# Patient Record
Sex: Male | Born: 1965 | Race: White | Hispanic: No | Marital: Married | Smoking: Current every day smoker
Health system: Southern US, Community
[De-identification: ages and names within clinical notes are randomized; demographics above are authoritative.]

## PROBLEM LIST (undated history)

## (undated) ENCOUNTER — Emergency Department (HOSPITAL_COMMUNITY): Payer: BC Managed Care – PPO | Source: Home / Self Care

## (undated) DIAGNOSIS — E785 Hyperlipidemia, unspecified: Secondary | ICD-10-CM

## (undated) DIAGNOSIS — G43909 Migraine, unspecified, not intractable, without status migrainosus: Secondary | ICD-10-CM

## (undated) DIAGNOSIS — F419 Anxiety disorder, unspecified: Secondary | ICD-10-CM

## (undated) DIAGNOSIS — I10 Essential (primary) hypertension: Secondary | ICD-10-CM

## (undated) HISTORY — DX: Essential (primary) hypertension: I10

## (undated) HISTORY — DX: Migraine, unspecified, not intractable, without status migrainosus: G43.909

## (undated) HISTORY — PX: FRACTURE SURGERY: SHX138

## (undated) HISTORY — DX: Hyperlipidemia, unspecified: E78.5

## (undated) HISTORY — DX: Anxiety disorder, unspecified: F41.9

---

## 1998-10-17 ENCOUNTER — Encounter: Payer: Self-pay | Admitting: Emergency Medicine

## 1998-10-17 ENCOUNTER — Emergency Department (HOSPITAL_COMMUNITY): Admission: EM | Admit: 1998-10-17 | Discharge: 1998-10-17 | Payer: Self-pay | Admitting: Emergency Medicine

## 2002-08-26 ENCOUNTER — Emergency Department (HOSPITAL_COMMUNITY): Admission: EM | Admit: 2002-08-26 | Discharge: 2002-08-26 | Payer: Self-pay | Admitting: Emergency Medicine

## 2002-08-26 ENCOUNTER — Encounter: Payer: Self-pay | Admitting: Emergency Medicine

## 2002-09-15 ENCOUNTER — Encounter (HOSPITAL_COMMUNITY): Admission: RE | Admit: 2002-09-15 | Discharge: 2002-10-15 | Payer: Self-pay | Admitting: *Deleted

## 2013-09-23 ENCOUNTER — Ambulatory Visit: Payer: Self-pay | Admitting: General Practice

## 2013-09-27 ENCOUNTER — Ambulatory Visit (INDEPENDENT_AMBULATORY_CARE_PROVIDER_SITE_OTHER): Payer: BC Managed Care – PPO

## 2013-09-27 ENCOUNTER — Encounter (INDEPENDENT_AMBULATORY_CARE_PROVIDER_SITE_OTHER): Payer: Self-pay

## 2013-09-27 ENCOUNTER — Encounter: Payer: Self-pay | Admitting: Family Medicine

## 2013-09-27 ENCOUNTER — Ambulatory Visit (INDEPENDENT_AMBULATORY_CARE_PROVIDER_SITE_OTHER): Payer: BC Managed Care – PPO | Admitting: Family Medicine

## 2013-09-27 VITALS — BP 146/98 | HR 97 | Temp 97.4°F | Ht 73.0 in | Wt 278.0 lb

## 2013-09-27 DIAGNOSIS — R42 Dizziness and giddiness: Secondary | ICD-10-CM

## 2013-09-27 DIAGNOSIS — M542 Cervicalgia: Secondary | ICD-10-CM

## 2013-09-27 DIAGNOSIS — S46909A Unspecified injury of unspecified muscle, fascia and tendon at shoulder and upper arm level, unspecified arm, initial encounter: Secondary | ICD-10-CM

## 2013-09-27 DIAGNOSIS — S4980XA Other specified injuries of shoulder and upper arm, unspecified arm, initial encounter: Secondary | ICD-10-CM

## 2013-09-27 DIAGNOSIS — S4991XA Unspecified injury of right shoulder and upper arm, initial encounter: Secondary | ICD-10-CM

## 2013-09-27 MED ORDER — PREDNISONE 10 MG PO TABS: ORAL_TABLET | ORAL | Status: DC

## 2013-09-27 MED ORDER — HYDROCODONE-ACETAMINOPHEN 5-325 MG PO TABS: 1.0000 | ORAL_TABLET | Freq: Four times a day (QID) | ORAL | Status: DC | PRN

## 2013-09-27 MED ORDER — METAXALONE 800 MG PO TABS: 800.0000 mg | ORAL_TABLET | Freq: Three times a day (TID) | ORAL | Status: DC

## 2013-09-27 NOTE — Progress Notes (Signed)
   Subjective:    Patient ID: Austin Combs, male    DOB: [birth year], 48 y.o.   MRN: [id number]  HPI  Patient is here for pre-syncope.  He states he has been having severe pain in his neck and his Left arm is numb.  Patient states he is in severe pain in his right arm, shoulder, and neck.  He gets headaches and he has one now.  He has neck discomfort.  He has   Review of Systems    No chest pain, SOB, HA, dizziness, vision change, N/V, diarrhea, constipation, dysuria, urinary urgency or frequency, myalgias, arthralgias or rash.  Objective:   Physical Exam  Vital signs noted  Well developed well nourished male.  HEENT - Head atraumatic Normocephalic                Eyes - PERRLA, Conjuctiva - clear Sclera- Clear EOMI and no nystagmus Respiratory - Lungs CTA bilateral Cardiac - RRR S1 and S2 without murmur MS - cervical spine with TTP and decreased ROM, TTP occiput, TTP right shoulder and  Trapezius.  Decreased strength in right arm.    EKG - NSR w/o acute st-t changes  Xray cervical spine - No fx Xray right shoulder - no fx Prelimnary reading by Chrissie NoaWilliam Oxford,FNP    Assessment & Plan:  Dizziness - Plan: EKG 12-Lead, HYDROcodone-acetaminophen (NORCO) 5-325 MG per tablet, predniSONE (DELTASONE) 10 MG tablet, DG Cervical Spine Complete, DG Shoulder Right, metaxalone (SKELAXIN) 800 MG tablet  Right shoulder injury - Plan: HYDROcodone-acetaminophen (NORCO) 5-325 MG per tablet, predniSONE (DELTASONE) 10 MG tablet, DG Cervical Spine Complete, DG Shoulder Right, metaxalone (SKELAXIN) 800 MG tablet  Neck pain - Plan: DG Shoulder Right  Follow up in one week or prn if not better.  Deatra CanterWilliam J Oxford FNP

## 2013-09-29 ENCOUNTER — Encounter: Payer: Self-pay | Admitting: Family Medicine

## 2013-09-29 ENCOUNTER — Telehealth: Payer: Self-pay | Admitting: Family Medicine

## 2013-09-29 ENCOUNTER — Ambulatory Visit (INDEPENDENT_AMBULATORY_CARE_PROVIDER_SITE_OTHER): Payer: BC Managed Care – PPO | Admitting: Family Medicine

## 2013-09-29 VITALS — BP 137/91 | HR 96 | Temp 99.1°F | Wt 271.6 lb

## 2013-09-29 DIAGNOSIS — M5030 Other cervical disc degeneration, unspecified cervical region: Secondary | ICD-10-CM

## 2013-09-29 MED ORDER — OXYCODONE-ACETAMINOPHEN 5-325 MG PO TABS
ORAL_TABLET | ORAL | Status: DC
Start: 2013-09-29 — End: 2013-10-05

## 2013-09-29 NOTE — Telephone Encounter (Signed)
Pt still having severe neck pain Taking meds Informed pt to come in for appt

## 2013-09-29 NOTE — Progress Notes (Signed)
   Subjective:    Patient ID: Austin Combs, male    DOB: 30-May-1966, 48 y.o.   MRN: [id number]  HPI This 48 y.o. male presents for evaluation of severe right neck, shoulder, and arm pain.  He was Seen a few days ago and he was rx'd steroid taper and norco pain medication and he states it is  Not helping and he cannot sleep and he is in severe pain.  He states the pain radiates down from His neck to his hands. He c/o right hand weakness and numbness.  He has had cervical spine Xray which shows DDD of the cervical spine.  He states his arm feels better if he raises it above his Head.   Review of Systems C/o cervical spine pain and cervical radicular sx's   No chest pain, SOB, HA, dizziness, vision change, N/V, diarrhea, constipation, dysuria, urinary urgency or frequency or rash.  Objective:   Physical Exam  Vital signs noted  Well developed well nourished male in pain.  HEENT - Head atraumatic Normocephalic                Eyes - PERRLA, Conjuctiva - clear Sclera- Clear EOMI                Ears - EAC's Wnl TM's Wnl Gross Hearing WNL                Throat - oropharanx wnl Respiratory - Lungs CTA bilateral Cardiac - RRR S1 and S2 without murmur MS - TTP cervical spine, right trapezius region.  Decreased strenght in right UE and hand.         Full ROM right shoulder. Neuro - Diminished sensation to palpation right arm and right hand.      Assessment & Plan:  DDD (degenerative disc disease), cervical - Plan: oxyCODONE-acetaminophen (ROXICET) 5-325 MG per tablet, MR Cervical Spine Wo Contrast Continue steroids, GET MRI ASAP and follow up next week or prn if not better.  Deatra CanterWilliam J Oxford FNP

## 2013-10-04 ENCOUNTER — Ambulatory Visit (HOSPITAL_COMMUNITY)
Admission: RE | Admit: 2013-10-04 | Discharge: 2013-10-04 | Disposition: A | Discharge status: Home / Self Care | Payer: BC Managed Care – PPO | Source: Ambulatory Visit | Attending: Family Medicine | Admitting: Family Medicine

## 2013-10-04 ENCOUNTER — Telehealth: Payer: Self-pay | Admitting: Family Medicine

## 2013-10-04 DIAGNOSIS — M5030 Other cervical disc degeneration, unspecified cervical region: Secondary | ICD-10-CM

## 2013-10-05 ENCOUNTER — Other Ambulatory Visit: Payer: Self-pay | Admitting: Family Medicine

## 2013-10-05 ENCOUNTER — Other Ambulatory Visit: Payer: Self-pay | Admitting: *Deleted

## 2013-10-05 MED ORDER — DIAZEPAM 5 MG PO TABS: ORAL_TABLET | ORAL | Status: DC

## 2013-10-05 MED ORDER — OXYCODONE-ACETAMINOPHEN 5-325 MG PO TABS: ORAL_TABLET | ORAL | Status: DC

## 2013-10-05 NOTE — Telephone Encounter (Signed)
RX given to pt's wife and notified of appt for open MRI today

## 2013-10-05 NOTE — Telephone Encounter (Signed)
Pt notified of MRI results Report faxed to Dr Cassandria SanteeBotero's office Pt will notify us if he does not hear from them tomorrow

## 2013-10-05 NOTE — Telephone Encounter (Signed)
Pt having extreme pain, could not do MRI due to pain Will scheduled open MRI ASAP and refill pain med

## 2013-10-07 ENCOUNTER — Telehealth: Payer: Self-pay | Admitting: Family Medicine

## 2013-10-07 NOTE — Telephone Encounter (Signed)
Spoke with pt Has appt with neurosurgeon on Monday

## 2013-10-07 NOTE — Telephone Encounter (Signed)
Please call.

## 2013-10-07 NOTE — Telephone Encounter (Signed)
They still have not heard from the neurologist and they said if they haven't heard anything from them that you told them to call us back by the end of the week

## 2013-10-13 ENCOUNTER — Telehealth: Payer: Self-pay | Admitting: Family Medicine

## 2013-10-13 NOTE — Telephone Encounter (Signed)
APPT MADE

## 2013-10-14 ENCOUNTER — Telehealth: Payer: Self-pay | Admitting: *Deleted

## 2013-10-14 NOTE — Telephone Encounter (Signed)
Pt had cervical fusion. Symptoms much improved. Needs note for work from 3/10-3/26 faxed to [phone]. [facility] Pt notified.

## 2013-10-19 ENCOUNTER — Ambulatory Visit (INDEPENDENT_AMBULATORY_CARE_PROVIDER_SITE_OTHER): Payer: BC Managed Care – PPO | Admitting: Family Medicine

## 2013-10-19 VITALS — BP 127/83 | HR 114 | Temp 98.3°F | Ht 73.0 in | Wt 255.6 lb

## 2013-10-19 DIAGNOSIS — S46909A Unspecified injury of unspecified muscle, fascia and tendon at shoulder and upper arm level, unspecified arm, initial encounter: Secondary | ICD-10-CM

## 2013-10-19 DIAGNOSIS — R42 Dizziness and giddiness: Secondary | ICD-10-CM

## 2013-10-19 DIAGNOSIS — S4980XA Other specified injuries of shoulder and upper arm, unspecified arm, initial encounter: Secondary | ICD-10-CM

## 2013-10-19 DIAGNOSIS — S4991XA Unspecified injury of right shoulder and upper arm, initial encounter: Secondary | ICD-10-CM

## 2013-10-19 NOTE — Progress Notes (Signed)
   Subjective:    Patient ID: Austin Combs, male    DOB: 29-May-1966, 48 y.o.   MRN: [id number]  HPI This 48 y.o. male presents for evaluation of follow up on cervical spine surgery. He is feeling a lot better and not in as much pain.  He still has some pain in his  Neck and shoulder area bilateral but is not hurting in his left arm.  He had elevated Blood pressure readings while in surgery.  He is due for CPE.   Review of Systems No chest pain, SOB, HA, dizziness, vision change, N/V, diarrhea, constipation, dysuria, urinary urgency or frequency, myalgias, arthralgias or rash.     Objective:   Physical Exam  Vital signs noted  Well developed well nourished male.  HEENT - Head atraumatic Normocephalic                Eyes - PERRLA, Conjuctiva - clear Sclera- Clear EOMI                Ears - EAC's Wnl TM's Wnl Gross Hearing WNL                Nose - Nares patent                 Throat - oropharanx wnl Respiratory - Lungs CTA bilateral Cardiac - RRR S1 and S2 without murmur GI - Abdomen soft Nontender and bowel sounds active x 4 Extremities - No edema. Neuro - Grossly intact.      Assessment & Plan:  Dizziness - Not currently bothering him and would recommend meclizine if sx's return  Right shoulder injury - Probably residual from surgery and would continue flexeril and percocet rx'd by neurosurgery.  Follow up in 2 - 3 weeks for CPE  Deatra CanterWilliam J Maeghan Canny FNP

## 2013-11-08 ENCOUNTER — Ambulatory Visit: Payer: BC Managed Care – PPO | Admitting: Family Medicine

## 2013-11-11 ENCOUNTER — Ambulatory Visit: Payer: BC Managed Care – PPO | Admitting: Family Medicine

## 2014-10-07 ENCOUNTER — Ambulatory Visit (INDEPENDENT_AMBULATORY_CARE_PROVIDER_SITE_OTHER): Payer: BLUE CROSS/BLUE SHIELD | Admitting: Family Medicine

## 2014-10-07 VITALS — BP 156/100 | HR 91 | Temp 97.6°F | Ht 73.0 in | Wt 282.0 lb

## 2014-10-07 DIAGNOSIS — R42 Dizziness and giddiness: Secondary | ICD-10-CM | POA: Diagnosis not present

## 2014-10-07 DIAGNOSIS — R072 Precordial pain: Secondary | ICD-10-CM

## 2014-10-07 DIAGNOSIS — F411 Generalized anxiety disorder: Secondary | ICD-10-CM | POA: Diagnosis not present

## 2014-10-07 MED ORDER — DULOXETINE HCL 30 MG PO CPEP: 30.0000 mg | ORAL_CAPSULE | Freq: Every day | ORAL | Status: DC

## 2014-10-07 NOTE — Progress Notes (Signed)
Subjective:  Patient ID: Austin Combs, male    DOB: 05-19-1966  Age: 49 y.o. MRN: [id number]  CC: Hypertension; pain in back of neck; Anxiety; and dizzy at times  Dizzines associated with sensation of being unaable to get a breath. HPI Austin Combs presents for lightheadedness comes in spells with unable to get breath. She feels substernal chest pain with this. Patient has description of multiple family situations and other stress source that have led to the limited ability to cope  History Austin Combs has a past medical history of Migraine.   He has past surgical history that includes Fracture surgery.   His family history includes Cancer in his father.He reports that he has been smoking.  He has never used smokeless tobacco. He reports that he does not drink alcohol or use illicit drugs.  Current Outpatient Prescriptions on File Prior to Visit  Medication Sig Dispense Refill  . HYDROcodone-acetaminophen (NORCO) 5-325 MG per tablet Take 1 tablet by mouth every 6 (six) hours as needed for moderate pain. (Patient not taking: Reported on 10/07/2014) 30 tablet 0  . oxyCODONE-acetaminophen (ROXICET) 5-325 MG per tablet One to two po q 6 hours prn pain (Patient not taking: Reported on 10/07/2014) 60 tablet 0   No current facility-administered medications on file prior to visit.    ROS Review of Systems  Constitutional: Negative for fever, chills, diaphoresis and unexpected weight change.  HENT: Negative for congestion, hearing loss, rhinorrhea, sore throat and trouble swallowing.   Respiratory: Negative for cough, chest tightness, shortness of breath and wheezing.   Gastrointestinal: Negative for nausea, vomiting, abdominal pain, diarrhea, constipation and abdominal distention.  Endocrine: Negative for cold intolerance and heat intolerance.  Genitourinary: Negative for dysuria, hematuria and flank pain.  Musculoskeletal: Negative for joint swelling and arthralgias.  Skin: Negative for  rash.  Neurological: Negative for dizziness and headaches.  Psychiatric/Behavioral: Positive for dysphoric mood and decreased concentration. Negative for agitation. The patient is nervous/anxious.     Objective:  BP 156/100 mmHg  Pulse 91  Temp(Src) 97.6 F (36.4 C) (Oral)  Ht  (1.854 m)  Wt 282 lb (127.914 kg)  BMI 37.21 kg/m2  BP Readings from Last 3 Encounters:  10/07/14 156/100  10/19/13 127/83  09/29/13 137/91    Wt Readings from Last 3 Encounters:  10/07/14 282 lb (127.914 kg)  10/19/13 255 lb 9.6 oz (115.939 kg)  09/29/13 271 lb 9.6 oz (123.197 kg)     Physical Exam  Constitutional: He is oriented to person, place, and time. He appears well-developed and well-nourished. No distress.  HENT:  Head: Normocephalic and atraumatic.  Right Ear: External ear normal.  Left Ear: External ear normal.  Nose: Nose normal.  Mouth/Throat: Oropharynx is clear and moist.  Eyes: Conjunctivae and EOM are normal. Pupils are equal, round, and reactive to light.  Neck: Normal range of motion. Neck supple. No thyromegaly present.  Cardiovascular: Normal rate, regular rhythm and normal heart sounds.   No murmur heard. Pulmonary/Chest: Effort normal and breath sounds normal. No respiratory distress. He has no wheezes. He has no rales.  Abdominal: Soft. Bowel sounds are normal. He exhibits no distension. There is no tenderness.  Lymphadenopathy:    He has no cervical adenopathy.  Neurological: He is alert and oriented to person, place, and time. He has normal reflexes.  Skin: Skin is warm and dry.  Psychiatric: He has a normal mood and affect. His behavior is normal. Judgment and thought content normal.    No  results found for: HGBA1C  No results found for: WBC, HGB, HCT, PLT, GLUCOSE, CHOL, TRIG, HDL, LDLDIRECT, LDLCALC, ALT, AST, NA, K, CL, CREATININE, BUN, CO2, TSH, PSA, INR, GLUF, HGBA1C, MICROALBUR  No results found.  Assessment & Plan:   Austin Combs was seen today for  hypertension, pain in back of neck, anxiety and dizzy at times.  Diagnoses and all orders for this visit:  Precordial pain Orders: -     Echocardiogram stress test with contrast; Future  Other orders -     DULoxetine (CYMBALTA) 30 MG capsule; Take 1 capsule (30 mg total) by mouth daily. For one week then two daily. Take with a full stomach at suppertime  I have discontinued Mr. Ulrey's cyclobenzaprine. I am also having him start on DULoxetine. Additionally, I am having him maintain his HYDROcodone-acetaminophen and oxyCODONE-acetaminophen.  Meds ordered this encounter  Medications  . DULoxetine (CYMBALTA) 30 MG capsule    Sig: Take 1 capsule (30 mg total) by mouth daily. For one week then two daily. Take with a full stomach at suppertime    Dispense:  60 capsule    Refill:  0     Follow-up: Return in about 1 month (around 11/07/2014) for anxiety.  Mechele ClaudeWarren Zilphia Kozinski, M.D.

## 2014-10-11 ENCOUNTER — Telehealth: Payer: Self-pay | Admitting: Family Medicine

## 2014-10-11 NOTE — Telephone Encounter (Signed)
Pt states since he started the cymbalta he has been feeling "numb" all over and like he is "floating". He has been having increased anxiety, and states he isn't going to take anymore but he is just miserable is there anything else you can do?

## 2014-10-12 MED ORDER — SERTRALINE HCL 100 MG PO TABS: 100.0000 mg | ORAL_TABLET | Freq: Every day | ORAL | Status: DC

## 2014-10-12 NOTE — Telephone Encounter (Signed)
New scrip sent to pharmacy. Can start it right after stopping the cymbalta. He has not been taking cymbalta long enough to need to taper.

## 2014-10-13 ENCOUNTER — Other Ambulatory Visit (HOSPITAL_COMMUNITY): Payer: Self-pay | Admitting: Cardiology

## 2014-10-13 ENCOUNTER — Ambulatory Visit (HOSPITAL_COMMUNITY): Payer: BLUE CROSS/BLUE SHIELD | Attending: Cardiology

## 2014-10-13 ENCOUNTER — Other Ambulatory Visit (HOSPITAL_COMMUNITY): Payer: Self-pay | Admitting: Family Medicine

## 2014-10-13 DIAGNOSIS — R072 Precordial pain: Secondary | ICD-10-CM | POA: Diagnosis present

## 2014-10-13 DIAGNOSIS — Z6837 Body mass index (BMI) 37.0-37.9, adult: Secondary | ICD-10-CM | POA: Insufficient documentation

## 2014-10-13 DIAGNOSIS — E669 Obesity, unspecified: Secondary | ICD-10-CM | POA: Diagnosis not present

## 2014-10-13 NOTE — Progress Notes (Signed)
Stress echo performed. 

## 2014-11-17 ENCOUNTER — Ambulatory Visit: Payer: BLUE CROSS/BLUE SHIELD | Admitting: Family Medicine

## 2014-12-12 IMAGING — CR DG CERVICAL SPINE COMPLETE 4+V
6 series · 6 of 6 positions shown · non-contrast
Comparison: none

[view not recorded (1 of 6)]
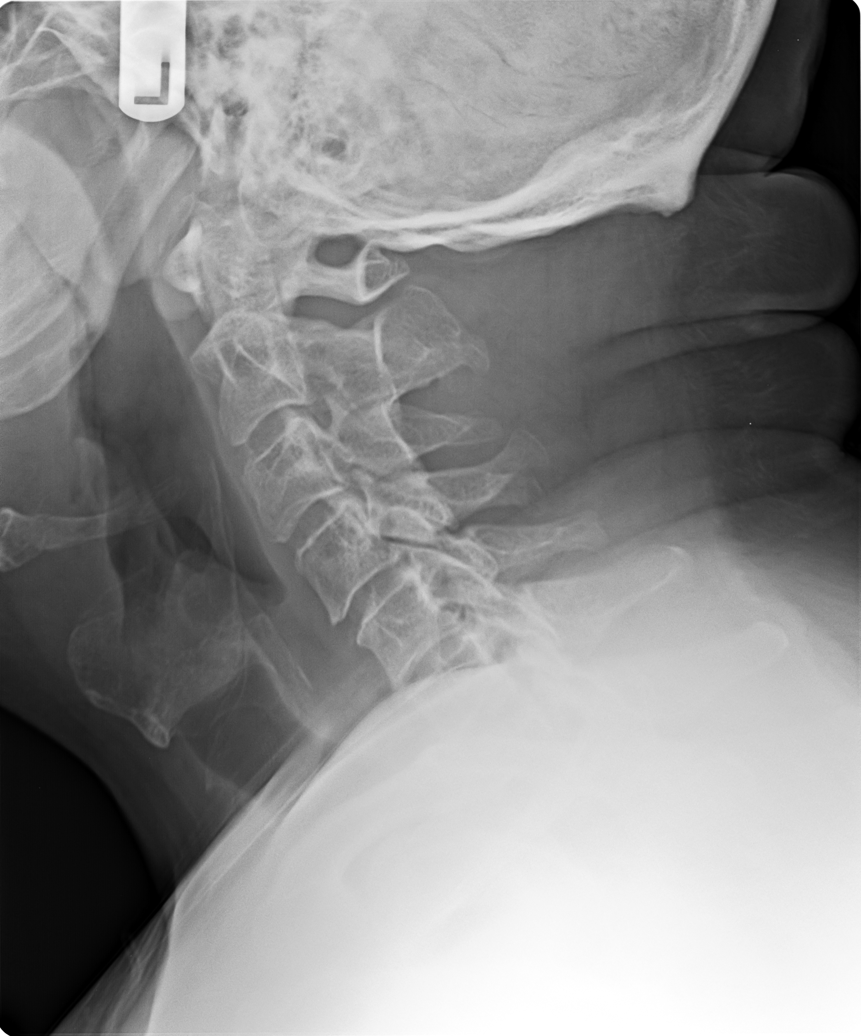

[view not recorded (2 of 6)]
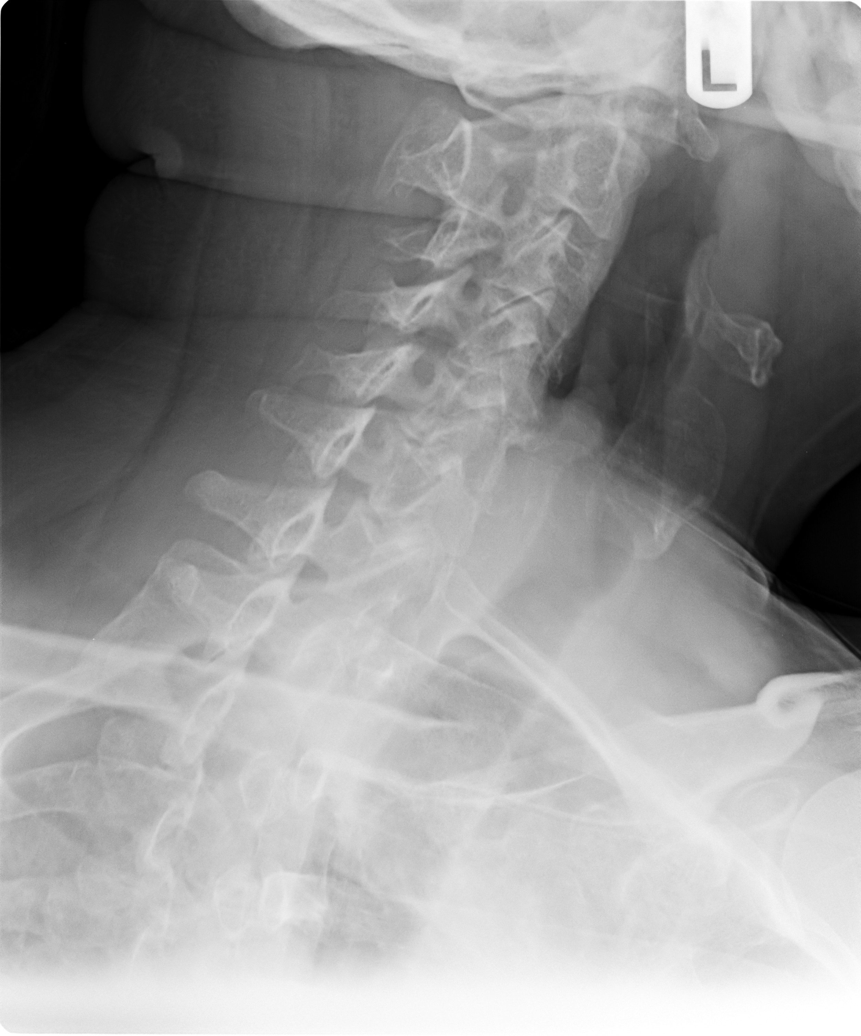

[view not recorded (3 of 6)]
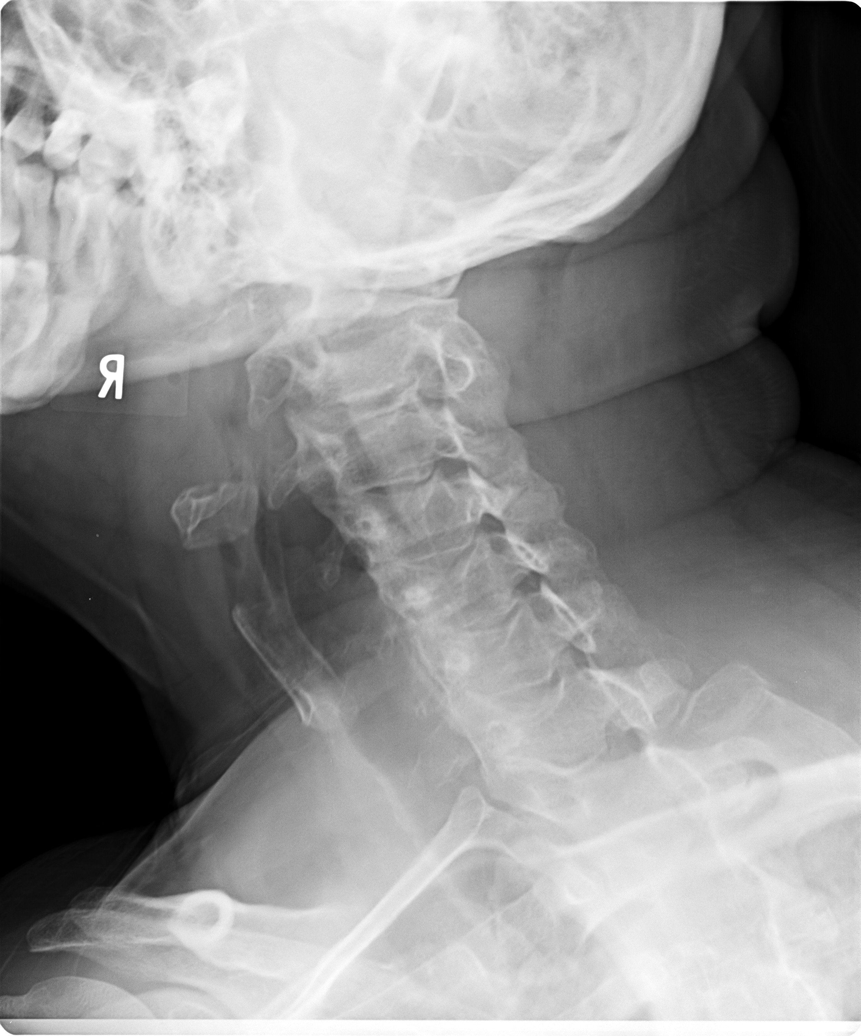

[view not recorded (4 of 6)]
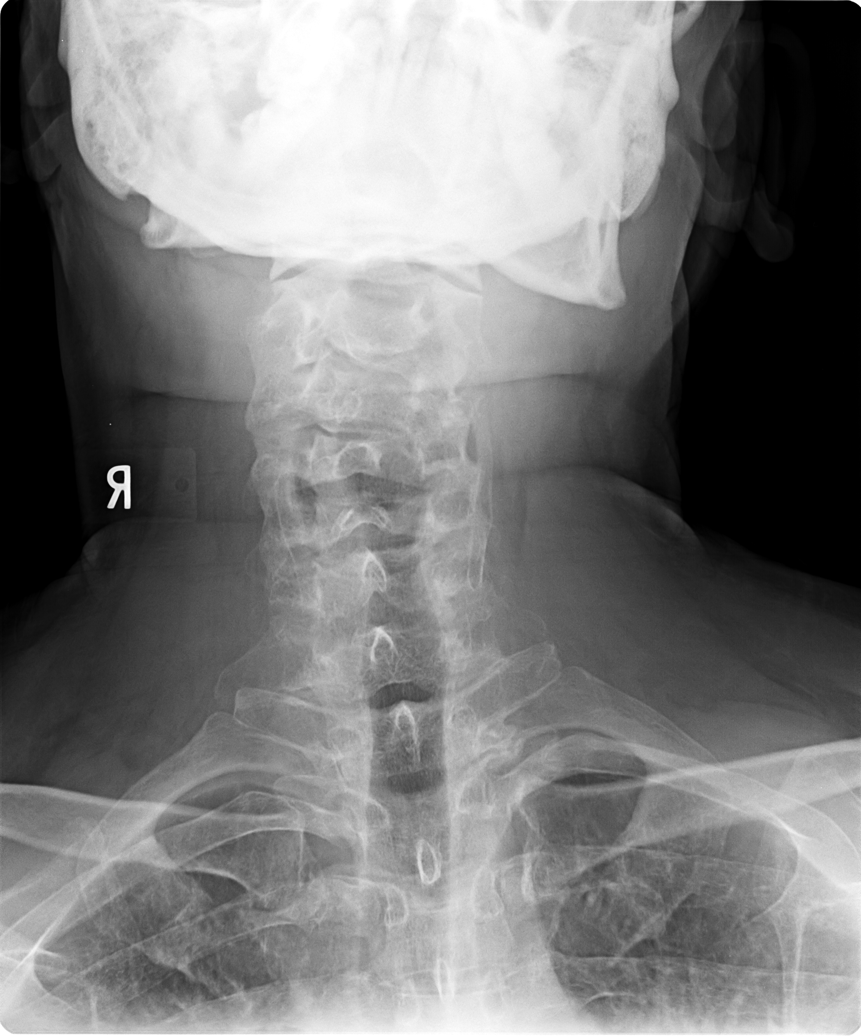

[view not recorded (5 of 6)]
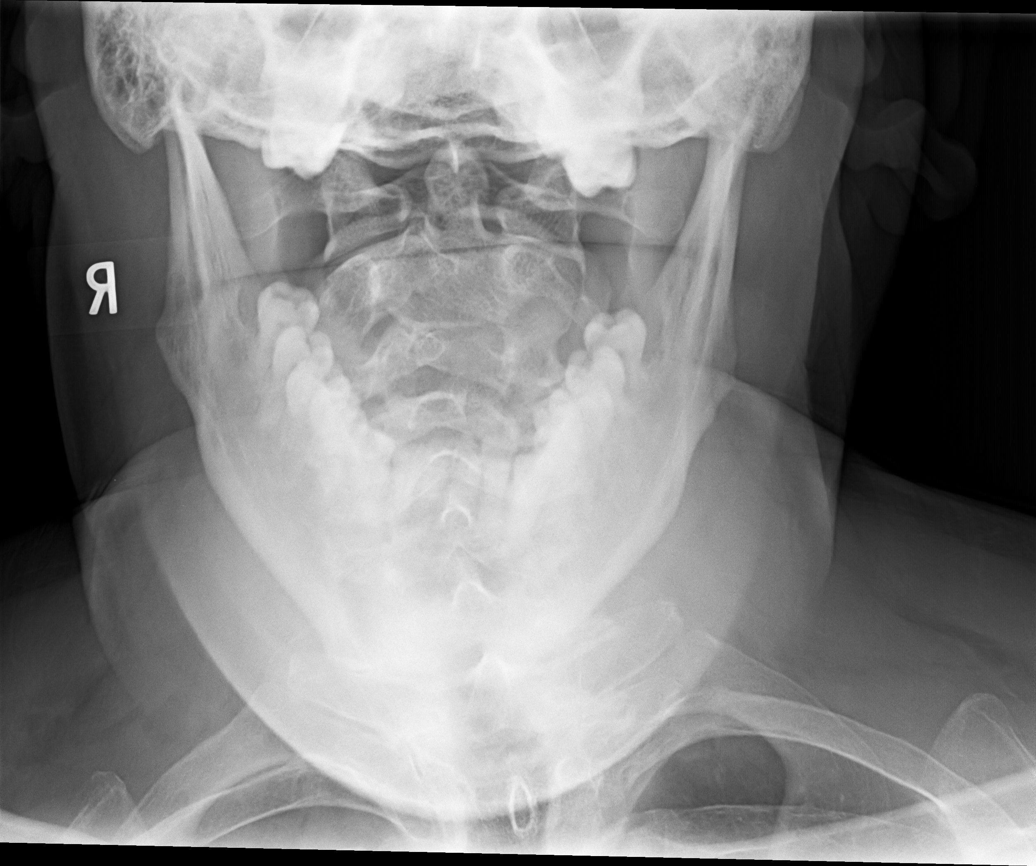

[view not recorded (6 of 6)]
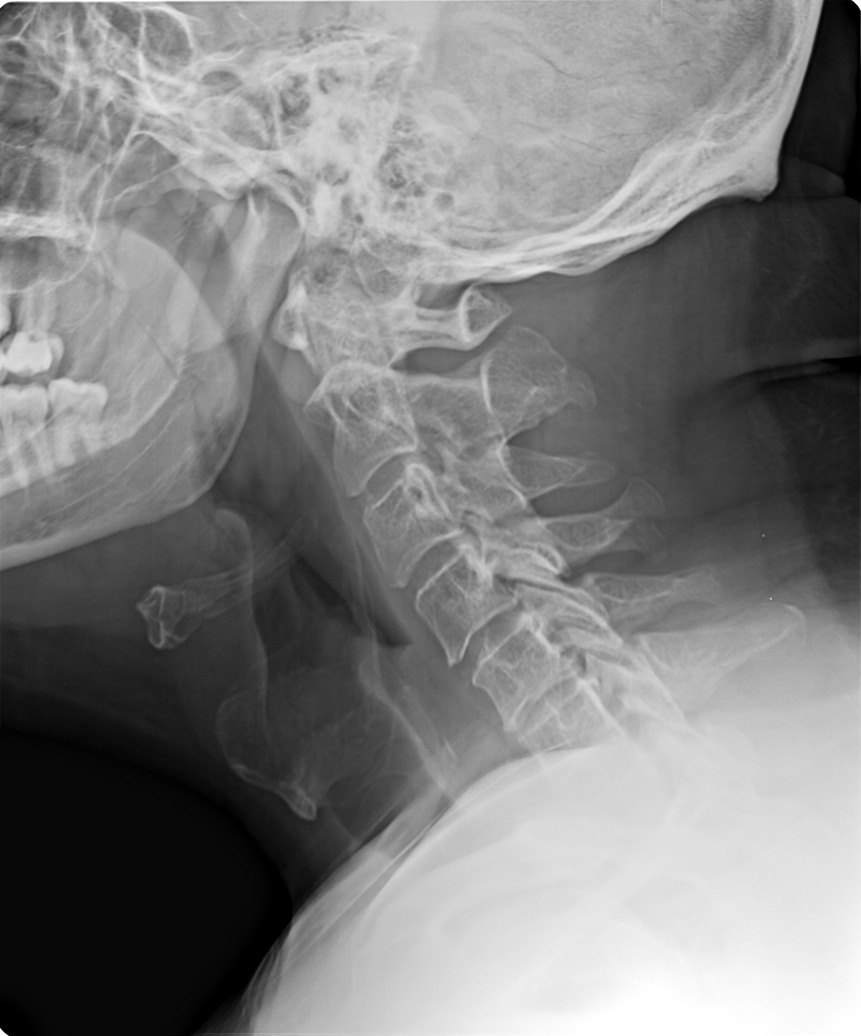

[6 of 6 positions shown; findings below may reference images not displayed]

CLINICAL DATA
Neck pain

EXAM
CERVICAL SPINE  4+ VIEWS

COMPARISON
None.

FINDINGS
C1 to the superior endplate of C6 is imaged on the provided lateral
radiograph.

There is obscuration of the C6-C7 as well as the cervical thoracic
junction secondary to overlying osseous and soft tissue structures.

Normal alignment of the cervical spine. No definite anterolisthesis
or retrolisthesis. The bilateral facets appear normally aligned. The
dens appears normally positioned between the lateral masses C1.

Cervical vertebral body heights appear preserved. Prevertebral soft
tissues appear normal.

Intervertebral disc spaces appear preserved.

The bilateral neural foramina appear patent given obliquity.

Regional soft tissues appear normal. Limited visualization lung
apices is normal.

IMPRESSION
Degraded examination without definite explanation for patient's neck
pain.

SIGNATURE

## 2015-07-20 ENCOUNTER — Ambulatory Visit (INDEPENDENT_AMBULATORY_CARE_PROVIDER_SITE_OTHER): Payer: BLUE CROSS/BLUE SHIELD | Admitting: Family

## 2015-07-20 ENCOUNTER — Encounter: Payer: Self-pay | Admitting: Family

## 2015-07-20 VITALS — BP 162/100 | HR 92 | Temp 97.4°F | Ht 73.0 in | Wt 279.0 lb

## 2015-07-20 DIAGNOSIS — I10 Essential (primary) hypertension: Secondary | ICD-10-CM | POA: Diagnosis not present

## 2015-07-20 DIAGNOSIS — F411 Generalized anxiety disorder: Secondary | ICD-10-CM | POA: Diagnosis not present

## 2015-07-20 DIAGNOSIS — E1159 Type 2 diabetes mellitus with other circulatory complications: Secondary | ICD-10-CM | POA: Insufficient documentation

## 2015-07-20 DIAGNOSIS — E669 Obesity, unspecified: Secondary | ICD-10-CM | POA: Diagnosis not present

## 2015-07-20 DIAGNOSIS — I152 Hypertension secondary to endocrine disorders: Secondary | ICD-10-CM | POA: Insufficient documentation

## 2015-07-20 MED ORDER — LOSARTAN POTASSIUM 100 MG PO TABS: 100.0000 mg | ORAL_TABLET | Freq: Every day | ORAL | Status: DC

## 2015-07-20 NOTE — Progress Notes (Signed)
Subjective:    Patient ID: Austin Combs, male    DOB: [birth year], 49 y.o.   MRN: [id number]  Pt presents to the office today to recheck HTN. PT was seen in the Urgent Care and was told he had HTN and was started on Lisinopril-Hctz 20-12.5 mg. PT states he took it this medication for 5 days, but states it made him dizzy, anxious, and just did not feel good. PT states he is having anxiety.  Hypertension This is a new problem. The current episode started more than 1 month ago. The problem has been waxing and waning since onset. The problem is uncontrolled. Associated symptoms include anxiety and headaches. Pertinent negatives include no blurred vision, palpitations, peripheral edema or shortness of breath. Risk factors for coronary artery disease include family history, obesity, smoking/tobacco exposure and stress. Past treatments include nothing. The current treatment provides no improvement. There is no history of kidney disease, CAD/MI, CVA, heart failure or a thyroid problem.  Anxiety Presents for follow-up visit. Onset was 1 to 5 years ago. Symptoms include excessive worry, irritability and nervous/anxious behavior. Patient reports no decreased concentration, palpitations, shortness of breath or suicidal ideas. Symptoms occur most days. The severity of symptoms is moderate.   His past medical history is significant for anxiety/panic attacks and depression. Past treatments include nothing. Compliance with prior treatments has been good.      Review of Systems  Constitutional: Positive for irritability.  Eyes: Negative for blurred vision.  Respiratory: Negative.  Negative for shortness of breath.   Cardiovascular: Negative.  Negative for palpitations.  Gastrointestinal: Negative.   Endocrine: Negative.   Genitourinary: Negative.   Musculoskeletal: Negative.   Neurological: Positive for headaches.  Hematological: Negative.   Psychiatric/Behavioral: Negative for suicidal ideas and  decreased concentration. The patient is nervous/anxious.   All other systems reviewed and are negative.      Objective:   Physical Exam  Constitutional: He is oriented to person, place, and time. He appears well-developed and well-nourished. No distress.  HENT:  Head: Normocephalic.  Right Ear: External ear normal.  Left Ear: External ear normal.  Nose: Nose normal.  Mouth/Throat: Oropharynx is clear and moist.  Eyes: Pupils are equal, round, and reactive to light. Right eye exhibits no discharge. Left eye exhibits no discharge.  Neck: Normal range of motion. Neck supple. No thyromegaly present.  Cardiovascular: Normal rate, regular rhythm, normal heart sounds and intact distal pulses.   No murmur heard. Pulmonary/Chest: Effort normal and breath sounds normal. No respiratory distress. He has no wheezes.  Abdominal: Soft. Bowel sounds are normal. He exhibits no distension. There is no tenderness.  Musculoskeletal: Normal range of motion. He exhibits no edema or tenderness.  Neurological: He is alert and oriented to person, place, and time. He has normal reflexes. No cranial nerve deficit.  Skin: Skin is warm and dry. No rash noted. No erythema.  Psychiatric: He has a normal mood and affect. His behavior is normal. Judgment and thought content normal.  Vitals reviewed.     Ht '6\' 1"'  (1.854 m)  Wt 279 lb (126.554 kg)  BMI 36.82 kg/m2     Assessment & Plan:  1. Essential hypertension -Pt started on Losartan 100 mg daily --Daily blood pressure log given with instructions on how to fill out and told to bring to next visit -Dash diet information given -Exercise encouraged - Stress Management  -Continue current meds -RTO in 2 weeks- If BP WNL on next visit will discuss GAD issues -  CMP14+EGFR - losartan (COZAAR) 100 MG tablet; Take 1 tablet (100 mg total) by mouth daily.  Dispense: 90 tablet; Refill: 1  2. GAD (generalized anxiety disorder) -Stress managment - CMP14+EGFR  3.  Obesity (BMI 35.0-39.9 without comorbidity) ([place])    Continue all meds Labs pending Health Maintenance reviewed Diet and exercise encouraged RTO 2 week for HTN  Evelina Dun, FNP

## 2015-07-20 NOTE — Patient Instructions (Signed)
DASH Eating Plan °DASH stands for "Dietary Approaches to Stop Hypertension." The DASH eating plan is a healthy eating plan that has been shown to reduce high blood pressure (hypertension). Additional health benefits may include reducing the risk of type 2 diabetes mellitus, heart disease, and stroke. The DASH eating plan may also help with weight loss. °WHAT DO I NEED TO KNOW ABOUT THE DASH EATING PLAN? °For the DASH eating plan, you will follow these general guidelines: °· Choose foods with a percent daily value for sodium of less than 5% (as listed on the food label). °· Use salt-free seasonings or herbs instead of table salt or sea salt. °· Check with your health care provider or pharmacist before using salt substitutes. °· Eat lower-sodium products, often labeled as "lower sodium" or "no salt added." °· Eat fresh foods. °· Eat more vegetables, fruits, and low-fat dairy products. °· Choose whole grains. Look for the word "whole" as the first word in the ingredient list. °· Choose fish and skinless chicken or turkey more often than red meat. Limit fish, poultry, and meat to 6 oz (170 g) each day. °· Limit sweets, desserts, sugars, and sugary drinks. °· Choose heart-healthy fats. °· Limit cheese to 1 oz (28 g) per day. °· Eat more home-cooked food and less restaurant, buffet, and fast food. °· Limit fried foods. °· Cook foods using methods other than frying. °· Limit canned vegetables. If you do use them, rinse them well to decrease the sodium. °· When eating at a restaurant, ask that your food be prepared with less salt, or no salt if possible. °WHAT FOODS CAN I EAT? °Seek help from a dietitian for individual calorie needs. °Grains °Whole grain or whole wheat bread. Brown rice. Whole grain or whole wheat pasta. Quinoa, bulgur, and whole grain cereals. Low-sodium cereals. Corn or whole wheat flour tortillas. Whole grain cornbread. Whole grain crackers. Low-sodium crackers. °Vegetables °Fresh or frozen vegetables  (raw, steamed, roasted, or grilled). Low-sodium or reduced-sodium tomato and vegetable juices. Low-sodium or reduced-sodium tomato sauce and paste. Low-sodium or reduced-sodium canned vegetables.  °Fruits °All fresh, canned (in natural juice), or frozen fruits. °Meat and Other Protein Products °Ground beef (85% or leaner), grass-fed beef, or beef trimmed of fat. Skinless chicken or turkey. Ground chicken or turkey. Pork trimmed of fat. All fish and seafood. Eggs. Dried beans, peas, or lentils. Unsalted nuts and seeds. Unsalted canned beans. °Dairy °Low-fat dairy products, such as skim or 1% milk, 2% or reduced-fat cheeses, low-fat ricotta or cottage cheese, or plain low-fat yogurt. Low-sodium or reduced-sodium cheeses. °Fats and Oils °Tub margarines without trans fats. Light or reduced-fat mayonnaise and salad dressings (reduced sodium). Avocado. Safflower, olive, or canola oils. Natural peanut or almond butter. °Other °Unsalted popcorn and pretzels. °The items listed above may not be a complete list of recommended foods or beverages. Contact your dietitian for more options. °WHAT FOODS ARE NOT RECOMMENDED? °Grains °White bread. White pasta. White rice. Refined cornbread. Bagels and croissants. Crackers that contain trans fat. °Vegetables °Creamed or fried vegetables. Vegetables in a cheese sauce. Regular canned vegetables. Regular canned tomato sauce and paste. Regular tomato and vegetable juices. °Fruits °Dried fruits. Canned fruit in light or heavy syrup. Fruit juice. °Meat and Other Protein Products °Fatty cuts of meat. Ribs, chicken wings, bacon, sausage, bologna, salami, chitterlings, fatback, hot dogs, bratwurst, and packaged luncheon meats. Salted nuts and seeds. Canned beans with salt. °Dairy °Whole or 2% milk, cream, half-and-half, and cream cheese. Whole-fat or sweetened yogurt. Full-fat   cheeses or blue cheese. Nondairy creamers and whipped toppings. Processed cheese, cheese spreads, or cheese  curds. °Condiments °Onion and garlic salt, seasoned salt, table salt, and sea salt. Canned and packaged gravies. Worcestershire sauce. Tartar sauce. Barbecue sauce. Teriyaki sauce. Soy sauce, including reduced sodium. Steak sauce. Fish sauce. Oyster sauce. Cocktail sauce. Horseradish. Ketchup and mustard. Meat flavorings and tenderizers. Bouillon cubes. Hot sauce. Tabasco sauce. Marinades. Taco seasonings. Relishes. °Fats and Oils °Butter, stick margarine, lard, shortening, ghee, and bacon fat. Coconut, palm kernel, or palm oils. Regular salad dressings. °Other °Pickles and olives. Salted popcorn and pretzels. °The items listed above may not be a complete list of foods and beverages to avoid. Contact your dietitian for more information. °WHERE CAN I FIND MORE INFORMATION? °[employer]: www.nhlbi.nih.gov/health/health-topics/topics/dash/ °  °This information is not intended to replace advice given to you by your health care provider. Make sure you discuss any questions you have with your health care provider. °  °Document Released: 06/26/2011 Document Revised: 07/28/2014 Document Reviewed: 05/11/2013 °Elsevier Interactive Patient Education ©2016 [employer] ° °Hypertension °Hypertension, commonly called high blood pressure, is when the force of blood pumping through your arteries is too strong. Your arteries are the blood vessels that carry blood from your heart throughout your body. A blood pressure reading consists of a higher number over a lower number, such as 110/72. The higher number (systolic) is the pressure inside your arteries when your heart pumps. The lower number (diastolic) is the pressure inside your arteries when your heart relaxes. Ideally you want your blood pressure below 120/80. °Hypertension forces your heart to work harder to pump blood. Your arteries may become narrow or stiff. Having untreated or uncontrolled hypertension can cause heart attack, stroke, kidney  disease, and other problems. °RISK FACTORS °Some risk factors for high blood pressure are controllable. Others are not.  °Risk factors you cannot control include:  °· Race. You may be at higher risk if you are African American. °· Age. Risk increases with age. °· Gender. Men are at higher risk than women before age 45 years. After age 65, women are at higher risk than men. °Risk factors you can control include: °· Not getting enough exercise or physical activity. °· Being overweight. °· Getting too much fat, sugar, calories, or salt in your diet. °· Drinking too much alcohol. °SIGNS AND SYMPTOMS °Hypertension does not usually cause signs or symptoms. Extremely high blood pressure (hypertensive crisis) may cause headache, anxiety, shortness of breath, and nosebleed. °DIAGNOSIS °To check if you have hypertension, your health care provider will measure your blood pressure while you are seated, with your arm held at the level of your heart. It should be measured at least twice using the same arm. Certain conditions can cause a difference in blood pressure between your right and left arms. A blood pressure reading that is higher than normal on one occasion does not mean that you need treatment. If it is not clear whether you have high blood pressure, you may be asked to return on a different day to have your blood pressure checked again. Or, you may be asked to monitor your blood pressure at home for 1 or more weeks. °TREATMENT °Treating high blood pressure includes making lifestyle changes and possibly taking medicine. Living a healthy lifestyle can help lower high blood pressure. You may need to change some of your habits. °Lifestyle changes may include: °· Following the DASH diet. This diet is high in fruits, vegetables, and whole   grains. It is low in salt, red meat, and added sugars. °· Keep your sodium intake below 2,300 mg per day. °· Getting at least 30-45 minutes of aerobic exercise at least 4 times per  week. °· Losing weight if necessary. °· Not smoking. °· Limiting alcoholic beverages. °· Learning ways to reduce stress. °Your health care provider may prescribe medicine if lifestyle changes are not enough to get your blood pressure under control, and if one of the following is true: °· You are 18-59 years of age and your systolic blood pressure is above 140. °· You are 60 years of age or older, and your systolic blood pressure is above 150. °· Your diastolic blood pressure is above 90. °· You have diabetes, and your systolic blood pressure is over 140 or your diastolic blood pressure is over 90. °· You have kidney disease and your blood pressure is above 140/90. °· You have heart disease and your blood pressure is above 140/90. °Your personal target blood pressure may vary depending on your medical conditions, your age, and other factors. °HOME CARE INSTRUCTIONS °· Have your blood pressure rechecked as directed by your health care provider.   °· Take medicines only as directed by your health care provider. Follow the directions carefully. Blood pressure medicines must be taken as prescribed. The medicine does not work as well when you skip doses. Skipping doses also puts you at risk for problems. °· Do not smoke.   °· Monitor your blood pressure at home as directed by your health care provider.  °SEEK MEDICAL CARE IF:  °· You think you are having a reaction to medicines taken. °· You have recurrent headaches or feel dizzy. °· You have swelling in your ankles. °· You have trouble with your vision. °SEEK IMMEDIATE MEDICAL CARE IF: °· You develop a severe headache or confusion. °· You have unusual weakness, numbness, or feel faint. °· You have severe chest or abdominal pain. °· You vomit repeatedly. °· You have trouble breathing. °MAKE SURE YOU:  °· Understand these instructions. °· Will watch your condition. °· Will get help right away if you are not doing well or get worse. °  °This information is not intended to  replace advice given to you by your health care provider. Make sure you discuss any questions you have with your health care provider. °  °Document Released: 07/07/2005 Document Revised: 11/21/2014 Document Reviewed: 04/29/2013 °Elsevier Interactive Patient Education ©2016 [employer] Inc[employer] ° °

## 2015-07-21 LAB — CMP14+EGFR
ALK PHOS: 106 IU/L (ref 39–117)
ALT: 25 IU/L (ref 0–44)
AST: 15 IU/L (ref 0–40)
Albumin/Globulin Ratio: 1.9 (ref 1.1–2.5)
Albumin: 4.4 g/dL (ref 3.5–5.5)
BUN/Creatinine Ratio: 16 (ref 9–20)
BUN: 13 mg/dL (ref 6–24)
Bilirubin Total: 0.2 mg/dL (ref 0.0–1.2)
CHLORIDE: 99 mmol/L (ref 96–106)
CO2: 24 mmol/L (ref 18–29)
Calcium: 9 mg/dL (ref 8.7–10.2)
Creatinine, Ser: 0.81 mg/dL (ref 0.76–1.27)
GFR calc non Af Amer: 104 mL/min/{1.73_m2} (ref >59)
GFR, EST AFRICAN AMERICAN: 120 mL/min/{1.73_m2} (ref >59)
GLUCOSE: 120 mg/dL — AB (ref 65–99)
Globulin, Total: 2.3 g/dL (ref 1.5–4.5)
Potassium: 4.2 mmol/L (ref 3.5–5.2)
Sodium: 140 mmol/L (ref 134–144)
TOTAL PROTEIN: 6.7 g/dL (ref 6.0–8.5)

## 2015-08-03 ENCOUNTER — Ambulatory Visit (INDEPENDENT_AMBULATORY_CARE_PROVIDER_SITE_OTHER): Payer: BLUE CROSS/BLUE SHIELD | Admitting: Family

## 2015-08-03 ENCOUNTER — Encounter: Payer: Self-pay | Admitting: Family

## 2015-08-03 VITALS — BP 123/77 | HR 99 | Temp 98.7°F | Ht 73.0 in | Wt 274.0 lb

## 2015-08-03 DIAGNOSIS — I10 Essential (primary) hypertension: Secondary | ICD-10-CM

## 2015-08-03 DIAGNOSIS — Z136 Encounter for screening for cardiovascular disorders: Secondary | ICD-10-CM | POA: Diagnosis not present

## 2015-08-03 DIAGNOSIS — IMO0001 Reserved for inherently not codable concepts without codable children: Secondary | ICD-10-CM

## 2015-08-03 DIAGNOSIS — F411 Generalized anxiety disorder: Secondary | ICD-10-CM | POA: Diagnosis not present

## 2015-08-03 DIAGNOSIS — Z13220 Encounter for screening for lipoid disorders: Secondary | ICD-10-CM | POA: Diagnosis not present

## 2015-08-03 DIAGNOSIS — E669 Obesity, unspecified: Secondary | ICD-10-CM | POA: Diagnosis not present

## 2015-08-03 DIAGNOSIS — Z1211 Encounter for screening for malignant neoplasm of colon: Secondary | ICD-10-CM | POA: Diagnosis not present

## 2015-08-03 MED ORDER — OXYCODONE-ACETAMINOPHEN 5-325 MG PO TABS: 1.0000 | ORAL_TABLET | Freq: Two times a day (BID) | ORAL | Status: DC | PRN

## 2015-08-03 MED ORDER — ESCITALOPRAM OXALATE 10 MG PO TABS: 10.0000 mg | ORAL_TABLET | Freq: Every day | ORAL | Status: DC

## 2015-08-03 NOTE — Patient Instructions (Signed)
Generalized Anxiety Disorder Generalized anxiety disorder (GAD) is a mental disorder. It interferes with life functions, including relationships, work, and school. GAD is different from normal anxiety, which everyone experiences at some point in their lives in response to specific life events and activities. Normal anxiety actually helps us prepare for and get through these life events and activities. Normal anxiety goes away after the event or activity is over.  GAD causes anxiety that is not necessarily related to specific events or activities. It also causes excess anxiety in proportion to specific events or activities. The anxiety associated with GAD is also difficult to control. GAD can vary from mild to severe. People with severe GAD can have intense waves of anxiety with physical symptoms (panic attacks).  SYMPTOMS The anxiety and worry associated with GAD are difficult to control. This anxiety and worry are related to many life events and activities and also occur more days than not for 6 months or longer. People with GAD also have three or more of the following symptoms (one or more in children):  Restlessness.   Fatigue.  Difficulty concentrating.   Irritability.  Muscle tension.  Difficulty sleeping or unsatisfying sleep. DIAGNOSIS GAD is diagnosed through an assessment by your health care provider. Your health care provider will ask you questions aboutyour mood,physical symptoms, and events in your life. Your health care provider may ask you about your medical history and use of alcohol or drugs, including prescription medicines. Your health care provider may also do a physical exam and blood tests. Certain medical conditions and the use of certain substances can cause symptoms similar to those associated with GAD. Your health care provider may refer you to a mental health specialist for further evaluation. TREATMENT The following therapies are usually used to treat GAD:    Medication. Antidepressant medication usually is prescribed for long-term daily control. Antianxiety medicines may be added in severe cases, especially when panic attacks occur.   Talk therapy (psychotherapy). Certain types of talk therapy can be helpful in treating GAD by providing support, education, and guidance. A form of talk therapy called cognitive behavioral therapy can teach you healthy ways to think about and react to daily life events and activities.  Stress managementtechniques. These include yoga, meditation, and exercise and can be very helpful when they are practiced regularly. A mental health specialist can help determine which treatment is best for you. Some people see improvement with one therapy. However, other people require a combination of therapies.   This information is not intended to replace advice given to you by your health care provider. Make sure you discuss any questions you have with your health care provider.   Document Released: 11/01/2012 Document Revised: 07/28/2014 Document Reviewed: 11/01/2012 Elsevier Interactive Patient Education 2016 [employer] Inc.  

## 2015-08-03 NOTE — Progress Notes (Signed)
Subjective:    Patient ID: Austin Combs, male    DOB: [birth year], 50 y.o.   MRN: [id number]  PT presents to the office today to recheck HTN. PT's BP is at goal today!! Pt states he continues to have GAD with chest tightness and anxious feeling.  Hypertension This is a new problem. The current episode started 1 to 4 weeks ago. The problem has been waxing and waning since onset. The problem is controlled. Associated symptoms include anxiety and palpitations. Pertinent negatives include no blurred vision, headaches or peripheral edema. Risk factors for coronary artery disease include obesity, male gender, smoking/tobacco exposure and stress. Past treatments include angiotensin blockers. The current treatment provides significant improvement. There is no history of kidney disease, CAD/MI, CVA, heart failure or a thyroid problem.  Anxiety Presents for initial visit. Onset was 1 to 5 years ago. The problem has been waxing and waning. Symptoms include excessive worry, irritability, nervous/anxious behavior, palpitations, panic and restlessness. Patient reports no dizziness or insomnia. Symptoms occur most days. The symptoms are aggravated by social activities.   His past medical history is significant for anxiety/panic attacks. Past treatments include nothing. The treatment provided no relief.      Review of Systems  Constitutional: Positive for irritability.  HENT: Negative.   Eyes: Negative for blurred vision.  Respiratory: Negative.   Cardiovascular: Positive for palpitations.  Gastrointestinal: Negative.   Endocrine: Negative.   Genitourinary: Negative.   Musculoskeletal: Negative.   Neurological: Negative.  Negative for dizziness and headaches.  Hematological: Negative.   Psychiatric/Behavioral: The patient is nervous/anxious. The patient does not have insomnia.   All other systems reviewed and are negative.      Objective:   Physical Exam  Constitutional: He is oriented to  person, place, and time. He appears well-developed and well-nourished. No distress.  HENT:  Head: Normocephalic.  Right Ear: External ear normal.  Left Ear: External ear normal.  Nose: Nose normal.  Mouth/Throat: Oropharynx is clear and moist.  Eyes: Pupils are equal, round, and reactive to light. Right eye exhibits no discharge. Left eye exhibits no discharge.  Neck: Normal range of motion. Neck supple. No thyromegaly present.  Cardiovascular: Normal rate, regular rhythm, normal heart sounds and intact distal pulses.   No murmur heard. Pulmonary/Chest: Effort normal and breath sounds normal. No respiratory distress. He has no wheezes.  Abdominal: Soft. Bowel sounds are normal. He exhibits no distension. There is no tenderness.  Musculoskeletal: Normal range of motion. He exhibits no edema or tenderness.  Neurological: He is alert and oriented to person, place, and time. He has normal reflexes. No cranial nerve deficit.  Skin: Skin is warm and dry. No rash noted. No erythema.  Psychiatric: He has a normal mood and affect. His behavior is normal. Judgment and thought content normal.  Vitals reviewed.     BP 123/77 mmHg  Pulse 99  Temp(Src) 98.7 F (37.1 C) (Oral)  Ht '6\' 1"'  (1.854 m)  Wt 274 lb (124.286 kg)  BMI 36.16 kg/m2     Assessment & Plan:  1. Essential hypertension -Dash diet information given -Exercise encouraged - Stress Management  -Continue current meds -RTO in 2 months - CMP14+EGFR  2. Obesity (BMI 35.0-39.9 without comorbidity) (HCC) - CMP14+EGFR  3. GAD (generalized anxiety disorder) -PT started on Lexapro 10 mg today -Stress management discussed - escitalopram (LEXAPRO) 10 MG tablet; Take 1 tablet (10 mg total) by mouth daily.  Dispense: 90 tablet; Refill: 3 - CMP14+EGFR  4. Encounter for  cholesteral screening for cardiovascular disease - CMP14+EGFR - Lipid panel  5. Colon cancer screening - CMP14+EGFR - Fecal occult blood, imunochemical;  Future   Continue all meds Labs pending Health Maintenance reviewed Diet and exercise encouraged RTO 2 months to recheck GAD  Evelina Dun, FNP

## 2015-08-04 LAB — CMP14+EGFR
A/G RATIO: 1.9 (ref 1.1–2.5)
ALBUMIN: 4.3 g/dL (ref 3.5–5.5)
ALK PHOS: 93 IU/L (ref 39–117)
ALT: 32 IU/L (ref 0–44)
AST: 18 IU/L (ref 0–40)
BUN/Creatinine Ratio: 14 (ref 9–20)
BUN: 11 mg/dL (ref 6–24)
Bilirubin Total: 0.3 mg/dL (ref 0.0–1.2)
CO2: 24 mmol/L (ref 18–29)
CREATININE: 0.77 mg/dL (ref 0.76–1.27)
Calcium: 9.1 mg/dL (ref 8.7–10.2)
Chloride: 101 mmol/L (ref 96–106)
GFR calc Af Amer: 122 mL/min/{1.73_m2} (ref >59)
GFR calc non Af Amer: 106 mL/min/{1.73_m2} (ref >59)
GLOBULIN, TOTAL: 2.3 g/dL (ref 1.5–4.5)
Glucose: 139 mg/dL — ABNORMAL HIGH (ref 65–99)
POTASSIUM: 4.4 mmol/L (ref 3.5–5.2)
Sodium: 142 mmol/L (ref 134–144)
Total Protein: 6.6 g/dL (ref 6.0–8.5)

## 2015-08-04 LAB — LIPID PANEL
CHOL/HDL RATIO: 8.6 ratio — AB (ref 0.0–5.0)
Cholesterol, Total: 198 mg/dL (ref 100–199)
HDL: 23 mg/dL — AB (ref >39)
LDL CALC: 104 mg/dL — AB (ref 0–99)
TRIGLYCERIDES: 355 mg/dL — AB (ref 0–149)
VLDL Cholesterol Cal: 71 mg/dL — ABNORMAL HIGH (ref 5–40)

## 2015-08-08 ENCOUNTER — Other Ambulatory Visit: Payer: Self-pay | Admitting: Family

## 2015-08-08 DIAGNOSIS — E782 Mixed hyperlipidemia: Secondary | ICD-10-CM

## 2015-08-08 DIAGNOSIS — E1169 Type 2 diabetes mellitus with other specified complication: Secondary | ICD-10-CM | POA: Insufficient documentation

## 2015-08-08 DIAGNOSIS — E785 Hyperlipidemia, unspecified: Secondary | ICD-10-CM

## 2015-08-08 MED ORDER — ATORVASTATIN CALCIUM 10 MG PO TABS: 10.0000 mg | ORAL_TABLET | Freq: Every day | ORAL | Status: DC

## 2015-09-11 ENCOUNTER — Telehealth: Payer: Self-pay | Admitting: Family

## 2015-09-11 MED ORDER — ESCITALOPRAM OXALATE 20 MG PO TABS: 20.0000 mg | ORAL_TABLET | Freq: Every day | ORAL | Status: DC

## 2015-09-11 NOTE — Telephone Encounter (Signed)
Lexapro increase to 20 mg daily

## 2015-09-11 NOTE — Telephone Encounter (Signed)
Patient aware and advised that if he wants medication changed he would need to be seen.

## 2015-09-11 NOTE — Telephone Encounter (Signed)
Patient states that the lexapro was helping and the other day he got real dizzy and his eyes started going in and out. He felt like he was going to pass out and he called 911 and was told it was anxiety attack. Please advise

## 2015-09-14 ENCOUNTER — Ambulatory Visit: Payer: BLUE CROSS/BLUE SHIELD | Admitting: Family

## 2015-09-14 ENCOUNTER — Other Ambulatory Visit: Payer: Self-pay | Admitting: Family Medicine

## 2015-09-14 MED ORDER — OXYCODONE-ACETAMINOPHEN 5-325 MG PO TABS: 1.0000 | ORAL_TABLET | Freq: Two times a day (BID) | ORAL | Status: DC | PRN

## 2015-09-14 NOTE — Telephone Encounter (Signed)
RX ready for pick up 

## 2015-09-14 NOTE — Telephone Encounter (Signed)
Patient notified

## 2015-09-28 ENCOUNTER — Ambulatory Visit (INDEPENDENT_AMBULATORY_CARE_PROVIDER_SITE_OTHER): Payer: BLUE CROSS/BLUE SHIELD | Admitting: Family

## 2015-09-28 ENCOUNTER — Encounter: Payer: Self-pay | Admitting: Family

## 2015-09-28 VITALS — BP 133/83 | HR 88 | Temp 98.9°F | Ht 73.0 in | Wt 265.0 lb

## 2015-09-28 DIAGNOSIS — I10 Essential (primary) hypertension: Secondary | ICD-10-CM | POA: Diagnosis not present

## 2015-09-28 DIAGNOSIS — F411 Generalized anxiety disorder: Secondary | ICD-10-CM

## 2015-09-28 NOTE — Progress Notes (Signed)
   Subjective:    Patient ID: Austin Combs, male    DOB: 1966/04/04, 50 y.o.   MRN: [id number]  Pt presents to the office today to recheck HTN and GAD. PT's BP is at goal today! Pt states his anxiety is much better since increasing his lexapro 20 mg.  Hypertension This is a chronic problem. The current episode started more than 1 month ago. The problem has been resolved since onset. The problem is controlled. Associated symptoms include anxiety and palpitations. Pertinent negatives include no headaches, peripheral edema or shortness of breath. Past treatments include ACE inhibitors. The current treatment provides moderate improvement. There is no history of kidney disease, CAD/MI, CVA, heart failure or a thyroid problem. There is no history of sleep apnea.  Anxiety Presents for follow-up visit. Onset was 1 to 5 years ago. The problem has been waxing and waning. Symptoms include excessive worry, nervous/anxious behavior, palpitations and panic. Patient reports no confusion, depressed mood, irritability or shortness of breath. Symptoms occur occasionally. The severity of symptoms is moderate. The symptoms are aggravated by family issues. The quality of sleep is good.   His past medical history is significant for anxiety/panic attacks. Past treatments include SSRIs. The treatment provided moderate relief. Compliance with prior treatments has been good.      Review of Systems  Constitutional: Negative.  Negative for irritability.  HENT: Negative.   Respiratory: Negative.  Negative for shortness of breath.   Cardiovascular: Positive for palpitations.  Gastrointestinal: Negative.   Endocrine: Negative.   Genitourinary: Negative.   Musculoskeletal: Negative.   Neurological: Negative.  Negative for headaches.  Hematological: Negative.   Psychiatric/Behavioral: Negative for confusion. The patient is nervous/anxious.   All other systems reviewed and are negative.      Objective:   Physical  Exam  Constitutional: He is oriented to person, place, and time. He appears well-developed and well-nourished. No distress.  HENT:  Head: Normocephalic.  Right Ear: External ear normal.  Left Ear: External ear normal.  Nose: Nose normal.  Mouth/Throat: Oropharynx is clear and moist.  Eyes: Pupils are equal, round, and reactive to light. Right eye exhibits no discharge. Left eye exhibits no discharge.  Neck: Normal range of motion. Neck supple. No thyromegaly present.  Cardiovascular: Normal rate, regular rhythm, normal heart sounds and intact distal pulses.   No murmur heard. Pulmonary/Chest: Effort normal and breath sounds normal. No respiratory distress. He has no wheezes.  Abdominal: Soft. Bowel sounds are normal. He exhibits no distension. There is no tenderness.  Musculoskeletal: Normal range of motion. He exhibits no edema or tenderness.  Neurological: He is alert and oriented to person, place, and time. He has normal reflexes. No cranial nerve deficit.  Skin: Skin is warm and dry. No rash noted. No erythema.  Psychiatric: He has a normal mood and affect. His behavior is normal. Judgment and thought content normal.  Vitals reviewed.   BP 133/83 mmHg  Pulse 88  Temp(Src) 98.9 F (37.2 C) (Oral)  Ht '6\' 1"'$  (1.854 m)  Wt 265 lb (120.203 kg)  BMI 34.97 kg/m2       Assessment & Plan:  1. Essential hypertension -Dash diet information given -Exercise encouraged - Stress Management  -Continue current meds -RTO in 3 months - BMP8+EGFR  2. GAD (generalized anxiety disorder) - BMP8+EGFR -Stress management discussed -Continue Lexapro 20 mg -If panic continues will add Wellbutrin  -RTO in 3 months  Evelina Dun, FNP

## 2015-09-28 NOTE — Patient Instructions (Signed)
Generalized Anxiety Disorder Generalized anxiety disorder (GAD) is a mental disorder. It interferes with life functions, including relationships, work, and school. GAD is different from normal anxiety, which everyone experiences at some point in their lives in response to specific life events and activities. Normal anxiety actually helps us prepare for and get through these life events and activities. Normal anxiety goes away after the event or activity is over.  GAD causes anxiety that is not necessarily related to specific events or activities. It also causes excess anxiety in proportion to specific events or activities. The anxiety associated with GAD is also difficult to control. GAD can vary from mild to severe. People with severe GAD can have intense waves of anxiety with physical symptoms (panic attacks).  SYMPTOMS The anxiety and worry associated with GAD are difficult to control. This anxiety and worry are related to many life events and activities and also occur more days than not for 6 months or longer. People with GAD also have three or more of the following symptoms (one or more in children):  Restlessness.   Fatigue.  Difficulty concentrating.   Irritability.  Muscle tension.  Difficulty sleeping or unsatisfying sleep. DIAGNOSIS GAD is diagnosed through an assessment by your health care provider. Your health care provider will ask you questions aboutyour mood,physical symptoms, and events in your life. Your health care provider may ask you about your medical history and use of alcohol or drugs, including prescription medicines. Your health care provider may also do a physical exam and blood tests. Certain medical conditions and the use of certain substances can cause symptoms similar to those associated with GAD. Your health care provider may refer you to a mental health specialist for further evaluation. TREATMENT The following therapies are usually used to treat GAD:    Medication. Antidepressant medication usually is prescribed for long-term daily control. Antianxiety medicines may be added in severe cases, especially when panic attacks occur.   Talk therapy (psychotherapy). Certain types of talk therapy can be helpful in treating GAD by providing support, education, and guidance. A form of talk therapy called cognitive behavioral therapy can teach you healthy ways to think about and react to daily life events and activities.  Stress managementtechniques. These include yoga, meditation, and exercise and can be very helpful when they are practiced regularly. A mental health specialist can help determine which treatment is best for you. Some people see improvement with one therapy. However, other people require a combination of therapies.   This information is not intended to replace advice given to you by your health care provider. Make sure you discuss any questions you have with your health care provider.   Document Released: 11/01/2012 Document Revised: 07/28/2014 Document Reviewed: 11/01/2012 Elsevier Interactive Patient Education 2016 [employer] Inc.  

## 2015-09-29 LAB — BMP8+EGFR
BUN / CREAT RATIO: 12 (ref 9–20)
BUN: 9 mg/dL (ref 6–24)
CALCIUM: 9.3 mg/dL (ref 8.7–10.2)
CHLORIDE: 101 mmol/L (ref 96–106)
CO2: 22 mmol/L (ref 18–29)
Creatinine, Ser: 0.78 mg/dL (ref 0.76–1.27)
GFR, EST AFRICAN AMERICAN: 122 mL/min/{1.73_m2} (ref >59)
GFR, EST NON AFRICAN AMERICAN: 105 mL/min/{1.73_m2} (ref >59)
Glucose: 94 mg/dL (ref 65–99)
POTASSIUM: 4.3 mmol/L (ref 3.5–5.2)
Sodium: 141 mmol/L (ref 134–144)

## 2015-10-22 ENCOUNTER — Other Ambulatory Visit: Payer: Self-pay | Admitting: *Deleted

## 2015-10-22 MED ORDER — OXYCODONE-ACETAMINOPHEN 5-325 MG PO TABS: 1.0000 | ORAL_TABLET | Freq: Two times a day (BID) | ORAL | Status: DC | PRN

## 2015-10-22 NOTE — Telephone Encounter (Signed)
Detailed message left for patient that rx is ready to be picked up 

## 2015-10-22 NOTE — Telephone Encounter (Signed)
RX ready for pick up. PT needs appointment for pain contract

## 2015-11-21 ENCOUNTER — Other Ambulatory Visit: Payer: Self-pay | Admitting: Family Medicine

## 2015-11-21 NOTE — Telephone Encounter (Signed)
Please advise 

## 2015-11-22 MED ORDER — OXYCODONE-ACETAMINOPHEN 5-325 MG PO TABS: 1.0000 | ORAL_TABLET | Freq: Two times a day (BID) | ORAL | Status: DC | PRN

## 2015-11-22 NOTE — Telephone Encounter (Signed)
RX ready for pick up. Pt needs appt for pain contract 

## 2015-11-22 NOTE — Telephone Encounter (Signed)
Left detailed message that rx is ready for pickup but will need to schedule an appt prior to any further refills on this prescription and to CB with any further questions or concerns.

## 2015-12-14 ENCOUNTER — Encounter: Payer: Self-pay | Admitting: Family

## 2015-12-14 ENCOUNTER — Ambulatory Visit (INDEPENDENT_AMBULATORY_CARE_PROVIDER_SITE_OTHER): Payer: BLUE CROSS/BLUE SHIELD | Admitting: Family

## 2015-12-14 VITALS — BP 131/85 | HR 89 | Temp 98.2°F | Ht 73.0 in | Wt 265.0 lb

## 2015-12-14 DIAGNOSIS — G8929 Other chronic pain: Principal | ICD-10-CM

## 2015-12-14 DIAGNOSIS — M549 Dorsalgia, unspecified: Secondary | ICD-10-CM | POA: Diagnosis not present

## 2015-12-14 DIAGNOSIS — Z1211 Encounter for screening for malignant neoplasm of colon: Secondary | ICD-10-CM

## 2015-12-14 DIAGNOSIS — Z0289 Encounter for other administrative examinations: Secondary | ICD-10-CM | POA: Insufficient documentation

## 2015-12-14 DIAGNOSIS — M542 Cervicalgia: Secondary | ICD-10-CM | POA: Insufficient documentation

## 2015-12-14 DIAGNOSIS — Z79899 Other long term (current) drug therapy: Secondary | ICD-10-CM | POA: Diagnosis not present

## 2015-12-14 DIAGNOSIS — F1120 Opioid dependence, uncomplicated: Secondary | ICD-10-CM | POA: Diagnosis not present

## 2015-12-14 MED ORDER — OXYCODONE-ACETAMINOPHEN 5-325 MG PO TABS: 1.0000 | ORAL_TABLET | Freq: Two times a day (BID) | ORAL | Status: DC | PRN

## 2015-12-14 MED ORDER — HYDROXYZINE HCL 25 MG PO TABS: 25.0000 mg | ORAL_TABLET | Freq: Three times a day (TID) | ORAL | Status: DC | PRN

## 2015-12-14 NOTE — Progress Notes (Signed)
Subjective:    Patient ID: Austin Combs, male    DOB: May 31, 1966, 50 y.o.   MRN: [id number]  HPI  [facility] Controlled Substance Abuse database reviewed- Yes  Depression screen [facility] 2/9 12/14/2015 08/03/2015  Decreased Interest 1 0  Down, Depressed, Hopeless 1 0  PHQ - 2 Score 2 0  Altered sleeping 2 -  Tired, decreased energy 3 -  Change in appetite 0 -  Feeling bad or failure about yourself  0 -  Trouble concentrating 0 -  Moving slowly or fidgety/restless 0 -  Suicidal thoughts 0 -  PHQ-9 Score 7 -  Difficult doing work/chores Not difficult at all -    GAD 7 : Generalized Anxiety Score 12/14/2015  Nervous, Anxious, on Edge 0  Control/stop worrying 0  Worry too much - different things 1  Trouble relaxing 0  Restless 0  Easily annoyed or irritable 1  Afraid - awful might happen 1  Total GAD 7 Score 3  Anxiety Difficulty Somewhat difficult       [facility] drug screen performed- Yes  SOAPP  0= never  1= seldom  2=sometimes  3= often  4= very often  How often do you have mood swings? 2 How often do you smoke a cigarette within an hour after waling up? 4 How often have you taken medication other than the way that it was prescribed?0 How often have you used illegal drugs in the past 5 years? 0 How often, in your lifetime, have you had legal problems or been arrested? 1  Score 7  Alcohol Audit - How often during the last year have found that you: 0-Never   1- Less than monthly   2- Monthly     3-Weekly     4-daily or almost daily  - found that you were not able to stop drinking once you started- 0 -failed to do what was normally expected of you because of drinking- 0 -needed a first drink in the morning- 0 -had a feeling of guilt or remorse after drinking- 0 -are/were unable to remember what happened the night before because of your drinking- 0  0- NO   2- yes but not in last year  4- yes during last year -Have you or someone else been injured because of  your drinking- 0 - Has anyone been concerned about your drinking or suggested you cut down- 0        TOTAL- 0  ( 0-7- alcohol education, 8-15- simple advice, 16-19 simple advice plus counseling, 20-40 referral for evaluation and treatment 0   Designated Pharmacy- [facility] [place], [place]  Pain assessment: Cause of pain- Chronic neck and back Pain location- neck and back  Pain on scale of 1-10- 5 Frequency- intermittent What increases pain-stress What makes pain Better-rest, pain medication   Pain management agreement reviewed and signed- Yes    Review of Systems  Musculoskeletal: Positive for back pain and neck pain.  All other systems reviewed and are negative.      Objective:   Physical Exam  Constitutional: He is oriented to person, place, and time. He appears well-developed and well-nourished. No distress.  HENT:  Head: Normocephalic.  Eyes: Pupils are equal, round, and reactive to light. Right eye exhibits no discharge. Left eye exhibits no discharge.  Neck: Normal range of motion. Neck supple. No thyromegaly present.  Cardiovascular: Normal rate, regular rhythm, normal heart sounds and intact distal pulses.   No murmur heard. Pulmonary/Chest: Effort normal and breath sounds  normal. No respiratory distress. He has no wheezes.  Abdominal: Soft. Bowel sounds are normal. He exhibits no distension. There is no tenderness.  Musculoskeletal: Normal range of motion. He exhibits no edema or tenderness.  Neurological: He is alert and oriented to person, place, and time.  Skin: Skin is warm and dry. No rash noted. No erythema.  Psychiatric: He has a normal mood and affect. His behavior is normal. Judgment and thought content normal.  Vitals reviewed.   BP 131/85 mmHg  Pulse 89  Temp(Src) 98.2 F (36.8 C) (Oral)  Ht 6\' 1"  (1.854 m)  Wt 265 lb (120.203 kg)  BMI 34.97 kg/m2       Assessment & Plan:  1. Chronic neck and back pain - oxyCODONE-acetaminophen (ROXICET) 5-325 MG  tablet; Take 1 tablet by mouth every 12 (twelve) hours as needed for severe pain.  Dispense: 30 tablet; Refill: 0 - oxyCODONE-acetaminophen (PERCOCET/ROXICET) 5-325 MG tablet; Take 1 tablet by mouth 2 (two) times daily as needed.  Dispense: 30 tablet; Refill: 0 - oxyCODONE-acetaminophen (ROXICET) 5-325 MG tablet; Take 1 tablet by mouth every 12 (twelve) hours as needed for severe pain.  Dispense: 30 tablet; Refill: 0 - ToxASSURE Select 13 (MW), Urine  2. Pain medication agreement signed - oxyCODONE-acetaminophen (ROXICET) 5-325 MG tablet; Take 1 tablet by mouth every 12 (twelve) hours as needed for severe pain.  Dispense: 30 tablet; Refill: 0 - oxyCODONE-acetaminophen (PERCOCET/ROXICET) 5-325 MG tablet; Take 1 tablet by mouth 2 (two) times daily as needed.  Dispense: 30 tablet; Refill: 0 - oxyCODONE-acetaminophen (ROXICET) 5-325 MG tablet; Take 1 tablet by mouth every 12 (twelve) hours as needed for severe pain.  Dispense: 30 tablet; Refill: 0 - ToxASSURE Select 13 (MW), Urine  3. Uncomplicated opioid dependence (HCC) - oxyCODONE-acetaminophen (ROXICET) 5-325 MG tablet; Take 1 tablet by mouth every 12 (twelve) hours as needed for severe pain.  Dispense: 30 tablet; Refill: 0 - oxyCODONE-acetaminophen (PERCOCET/ROXICET) 5-325 MG tablet; Take 1 tablet by mouth 2 (two) times daily as needed.  Dispense: 30 tablet; Refill: 0 - oxyCODONE-acetaminophen (ROXICET) 5-325 MG tablet; Take 1 tablet by mouth every 12 (twelve) hours as needed for severe pain.  Dispense: 30 tablet; Refill: 0 - ToxASSURE Select 13 (MW), Urine   Continue all meds Labs pending Health Maintenance reviewed Diet and exercise encouraged RTO 3 months  Jannifer Rodneyhristy Hawks, FNP

## 2015-12-14 NOTE — Patient Instructions (Signed)

## 2015-12-21 LAB — TOXASSURE SELECT 13 (MW), URINE

## 2015-12-31 ENCOUNTER — Ambulatory Visit: Payer: BLUE CROSS/BLUE SHIELD | Admitting: Family Medicine

## 2016-01-05 ENCOUNTER — Other Ambulatory Visit: Payer: Self-pay | Admitting: Family

## 2016-02-05 ENCOUNTER — Other Ambulatory Visit: Payer: Self-pay | Admitting: Family

## 2016-03-05 ENCOUNTER — Other Ambulatory Visit: Payer: Self-pay | Admitting: Family

## 2016-03-07 ENCOUNTER — Telehealth: Payer: Self-pay | Admitting: Family

## 2016-03-07 NOTE — Telephone Encounter (Signed)
Appt given per wifes request

## 2016-03-10 ENCOUNTER — Ambulatory Visit (INDEPENDENT_AMBULATORY_CARE_PROVIDER_SITE_OTHER): Payer: BLUE CROSS/BLUE SHIELD | Admitting: Nurse Practitioner

## 2016-03-10 ENCOUNTER — Encounter: Payer: Self-pay | Admitting: Nurse Practitioner

## 2016-03-10 VITALS — BP 126/79 | HR 93 | Temp 98.3°F | Ht 73.0 in | Wt 274.0 lb

## 2016-03-10 DIAGNOSIS — L989 Disorder of the skin and subcutaneous tissue, unspecified: Secondary | ICD-10-CM

## 2016-03-10 NOTE — Progress Notes (Signed)
   Subjective:    Patient ID: Austin Combs, male    DOB: 1965/10/22, 50 y.o.   MRN: [id number]  HPI Patient in today c/o lesion on face that bleeds off and on. Has been on his face for about a month    Review of Systems  Constitutional: Negative.   HENT: Negative.   Respiratory: Negative.   Cardiovascular: Negative.   Endocrine: Positive for polyuria.  Neurological: Negative.   Psychiatric/Behavioral: Negative.   All other systems reviewed and are negative.      Objective:   Physical Exam  Constitutional: He appears well-developed and well-nourished.  Cardiovascular: Normal rate, regular rhythm and normal heart sounds.   Pulmonary/Chest: Effort normal and breath sounds normal.  Neurological: He is alert.  Skin: Skin is warm.  2cm raised flesh colored lesion with central opening- no discharge   BP 126/79   Pulse 93   Temp 98.3 F (36.8 C) (Oral)   Ht 6\' 1"  (1.854 m)   Wt 274 lb (124.3 kg)   BMI 36.15 kg/m      Assessment & Plan:   1. Facial lesion    Do not pick or scratch at lesion RTO prn  Mary-Margaret Daphine DeutscherMartin, FNP

## 2016-03-11 ENCOUNTER — Ambulatory Visit: Payer: BLUE CROSS/BLUE SHIELD | Admitting: Family

## 2016-03-18 ENCOUNTER — Encounter: Payer: Self-pay | Admitting: Family

## 2016-03-18 ENCOUNTER — Ambulatory Visit (INDEPENDENT_AMBULATORY_CARE_PROVIDER_SITE_OTHER): Payer: BLUE CROSS/BLUE SHIELD | Admitting: Family

## 2016-03-18 VITALS — BP 125/83 | HR 86 | Temp 98.1°F | Ht 73.0 in | Wt 273.0 lb

## 2016-03-18 DIAGNOSIS — Z0289 Encounter for other administrative examinations: Secondary | ICD-10-CM

## 2016-03-18 DIAGNOSIS — M549 Dorsalgia, unspecified: Secondary | ICD-10-CM

## 2016-03-18 DIAGNOSIS — I10 Essential (primary) hypertension: Secondary | ICD-10-CM | POA: Diagnosis not present

## 2016-03-18 DIAGNOSIS — Z79899 Other long term (current) drug therapy: Secondary | ICD-10-CM

## 2016-03-18 DIAGNOSIS — Z1211 Encounter for screening for malignant neoplasm of colon: Secondary | ICD-10-CM

## 2016-03-18 DIAGNOSIS — E8881 Metabolic syndrome: Secondary | ICD-10-CM | POA: Diagnosis not present

## 2016-03-18 DIAGNOSIS — F17200 Nicotine dependence, unspecified, uncomplicated: Secondary | ICD-10-CM

## 2016-03-18 DIAGNOSIS — M542 Cervicalgia: Secondary | ICD-10-CM

## 2016-03-18 DIAGNOSIS — E782 Mixed hyperlipidemia: Secondary | ICD-10-CM

## 2016-03-18 DIAGNOSIS — Z720 Tobacco use: Secondary | ICD-10-CM

## 2016-03-18 DIAGNOSIS — E669 Obesity, unspecified: Secondary | ICD-10-CM

## 2016-03-18 DIAGNOSIS — F1120 Opioid dependence, uncomplicated: Secondary | ICD-10-CM | POA: Diagnosis not present

## 2016-03-18 DIAGNOSIS — F411 Generalized anxiety disorder: Secondary | ICD-10-CM | POA: Diagnosis not present

## 2016-03-18 DIAGNOSIS — G8929 Other chronic pain: Secondary | ICD-10-CM

## 2016-03-18 MED ORDER — OXYCODONE-ACETAMINOPHEN 5-325 MG PO TABS: 1.0000 | ORAL_TABLET | Freq: Two times a day (BID) | ORAL | 0 refills | Status: DC | PRN

## 2016-03-18 NOTE — Patient Instructions (Signed)

## 2016-03-18 NOTE — Progress Notes (Addendum)
Subjective:    Patient ID: Austin Combs, male    DOB: [birth year], 50 y.o.   MRN: [id number]  Pt presents to the office today for chronic follow up and pain medication refill.  Medication Refill  Associated symptoms include neck pain. Pertinent negatives include no headaches.  Hypertension  This is a chronic problem. The current episode started more than 1 month ago. The problem has been resolved since onset. The problem is controlled. Associated symptoms include anxiety and neck pain. Pertinent negatives include no headaches, palpitations, peripheral edema or shortness of breath. Past treatments include angiotensin blockers. The current treatment provides significant improvement. There is no history of kidney disease, CAD/MI, CVA, heart failure or a thyroid problem. There is no history of sleep apnea.  Anxiety  Presents for follow-up visit. Onset was 1 to 5 years ago. The problem has been waxing and waning. Symptoms include excessive worry, irritability, nervous/anxious behavior and panic. Patient reports no confusion, depressed mood, palpitations or shortness of breath. Symptoms occur occasionally. The severity of symptoms is moderate. The symptoms are aggravated by family issues. The quality of sleep is good.   His past medical history is significant for anxiety/panic attacks. Past treatments include SSRIs. The treatment provided moderate relief. Compliance with prior treatments has been good.  Hyperlipidemia  This is a chronic problem. The current episode started more than 1 year ago. The problem is uncontrolled. Recent lipid tests were reviewed and are high. Exacerbating diseases include obesity. Pertinent negatives include no shortness of breath. Current antihyperlipidemic treatment includes statins. The current treatment provides significant improvement of lipids. Risk factors for coronary artery disease include diabetes mellitus, dyslipidemia, male sex, obesity, hypertension, a sedentary  lifestyle and family history.  Neck Pain   This is a chronic problem. The current episode started more than 1 year ago. The problem occurs intermittently. The problem has been waxing and waning. The pain is present in the anterior neck. The quality of the pain is described as aching. The pain is moderate. Pertinent negatives include no headaches. He has tried oral narcotics for the symptoms. The treatment provided moderate relief.  Metabolic Syndrome Pt obese and trying to be on low fat diet and exercise at times.     Review of Systems  Constitutional: Positive for irritability.  HENT: Negative.   Respiratory: Negative.  Negative for shortness of breath.   Cardiovascular: Negative for palpitations.  Gastrointestinal: Negative.   Endocrine: Negative.   Genitourinary: Negative.   Musculoskeletal: Positive for neck pain.  Neurological: Negative.  Negative for headaches.  Hematological: Negative.   Psychiatric/Behavioral: Negative for confusion. The patient is nervous/anxious.   All other systems reviewed and are negative.      Objective:   Physical Exam  Constitutional: He is oriented to person, place, and time. He appears well-developed and well-nourished. No distress.  HENT:  Head: Normocephalic.  Right Ear: External ear normal.  Left Ear: External ear normal.  Nose: Mucosal edema and rhinorrhea present.  Mouth/Throat: Posterior oropharyngeal erythema present.  Eyes: Pupils are equal, round, and reactive to light. Right eye exhibits no discharge. Left eye exhibits no discharge.  Neck: Normal range of motion. Neck supple. No thyromegaly present.  Cardiovascular: Normal rate, regular rhythm, normal heart sounds and intact distal pulses.   No murmur heard. Pulmonary/Chest: Effort normal and breath sounds normal. No respiratory distress. He has no wheezes.  Abdominal: Soft. Bowel sounds are normal. He exhibits no distension. There is no tenderness.  Musculoskeletal: Normal range of  motion.  He exhibits no edema or tenderness.  Neurological: He is alert and oriented to person, place, and time. He has normal reflexes. No cranial nerve deficit.  Skin: Skin is warm and dry. No rash noted. No erythema.  Lesion on right cheek, pt has appt with derm on 04/10/16  Psychiatric: He has a normal mood and affect. His behavior is normal. Judgment and thought content normal.  Vitals reviewed.   BP 125/83   Pulse 86   Temp 98.1 F (36.7 C) (Oral)   Ht '6\' 1"'  (1.854 m)   Wt 273 lb (123.8 kg)   BMI 36.02 kg/m        Assessment & Plan:  1. Essential hypertension - CMP14+EGFR  2. Chronic neck and back pain - CMP14+EGFR - oxyCODONE-acetaminophen (PERCOCET/ROXICET) 5-325 MG tablet; Take 1 tablet by mouth 2 (two) times daily as needed.  Dispense: 30 tablet; Refill: 0 - oxyCODONE-acetaminophen (ROXICET) 5-325 MG tablet; Take 1 tablet by mouth every 12 (twelve) hours as needed for severe pain.  Dispense: 30 tablet; Refill: 0 - oxyCODONE-acetaminophen (ROXICET) 5-325 MG tablet; Take 1 tablet by mouth every 12 (twelve) hours as needed for severe pain.  Dispense: 30 tablet; Refill: 0  3. GAD (generalized anxiety disorder) - CMP14+EGFR  4. Hyperlipemia, mixed - CMP14+EGFR - Lipid panel  5. Obesity (BMI 35.0-39.9 without comorbidity) (HCC) - CMP14+EGFR  6. Uncomplicated opioid dependence ([place]) - CMP14+EGFR - oxyCODONE-acetaminophen (PERCOCET/ROXICET) 5-325 MG tablet; Take 1 tablet by mouth 2 (two) times daily as needed.  Dispense: 30 tablet; Refill: 0 - oxyCODONE-acetaminophen (ROXICET) 5-325 MG tablet; Take 1 tablet by mouth every 12 (twelve) hours as needed for severe pain.  Dispense: 30 tablet; Refill: 0 - oxyCODONE-acetaminophen (ROXICET) 5-325 MG tablet; Take 1 tablet by mouth every 12 (twelve) hours as needed for severe pain.  Dispense: 30 tablet; Refill: 0  7. Pain medication agreement signed - CMP14+EGFR - oxyCODONE-acetaminophen (PERCOCET/ROXICET) 5-325 MG tablet; Take  1 tablet by mouth 2 (two) times daily as needed.  Dispense: 30 tablet; Refill: 0 - oxyCODONE-acetaminophen (ROXICET) 5-325 MG tablet; Take 1 tablet by mouth every 12 (twelve) hours as needed for severe pain.  Dispense: 30 tablet; Refill: 0 - oxyCODONE-acetaminophen (ROXICET) 5-325 MG tablet; Take 1 tablet by mouth every 12 (twelve) hours as needed for severe pain.  Dispense: 30 tablet; Refill: 0  8. Metabolic syndrome - EZM62+HUTM  9. Colon cancer screening - CMP14+EGFR - Fecal occult blood, imunochemical; Future  10. Current smoker -Smoking cessation discussed  [place] Controlled database reviewed.  Continue all meds Labs pending Health Maintenance reviewed Diet and exercise encouraged RTO 3 months  Evelina Dun, FNP

## 2016-03-19 ENCOUNTER — Other Ambulatory Visit: Payer: Self-pay | Admitting: Family

## 2016-03-19 LAB — CMP14+EGFR
ALBUMIN: 4.4 g/dL (ref 3.5–5.5)
ALT: 16 IU/L (ref 0–44)
AST: 9 IU/L (ref 0–40)
Albumin/Globulin Ratio: 1.8 (ref 1.2–2.2)
Alkaline Phosphatase: 95 IU/L (ref 39–117)
BUN / CREAT RATIO: 17 (ref 9–20)
BUN: 13 mg/dL (ref 6–24)
Bilirubin Total: 0.3 mg/dL (ref 0.0–1.2)
CALCIUM: 9.5 mg/dL (ref 8.7–10.2)
CO2: 25 mmol/L (ref 18–29)
CREATININE: 0.75 mg/dL — AB (ref 0.76–1.27)
Chloride: 101 mmol/L (ref 96–106)
GFR calc Af Amer: 124 mL/min/{1.73_m2} (ref >59)
GFR, EST NON AFRICAN AMERICAN: 107 mL/min/{1.73_m2} (ref >59)
GLOBULIN, TOTAL: 2.4 g/dL (ref 1.5–4.5)
GLUCOSE: 127 mg/dL — AB (ref 65–99)
Potassium: 4.1 mmol/L (ref 3.5–5.2)
SODIUM: 142 mmol/L (ref 134–144)
TOTAL PROTEIN: 6.8 g/dL (ref 6.0–8.5)

## 2016-03-19 LAB — LIPID PANEL
CHOL/HDL RATIO: 5.9 ratio — AB (ref 0.0–5.0)
Cholesterol, Total: 154 mg/dL (ref 100–199)
HDL: 26 mg/dL — ABNORMAL LOW (ref >39)
LDL CALC: 67 mg/dL (ref 0–99)
Triglycerides: 306 mg/dL — ABNORMAL HIGH (ref 0–149)
VLDL CHOLESTEROL CAL: 61 mg/dL — AB (ref 5–40)

## 2016-03-19 MED ORDER — ICOSAPENT ETHYL 1 G PO CAPS: 2000.0000 mg | ORAL_CAPSULE | Freq: Two times a day (BID) | ORAL | 6 refills | Status: DC

## 2016-03-28 ENCOUNTER — Telehealth: Payer: Self-pay | Admitting: Family

## 2016-03-28 MED ORDER — ATORVASTATIN CALCIUM 10 MG PO TABS: ORAL_TABLET | ORAL | 1 refills | Status: DC

## 2016-03-28 MED ORDER — ESCITALOPRAM OXALATE 20 MG PO TABS: 20.0000 mg | ORAL_TABLET | Freq: Every day | ORAL | 1 refills | Status: DC

## 2016-03-28 MED ORDER — LOSARTAN POTASSIUM 100 MG PO TABS: ORAL_TABLET | ORAL | 1 refills | Status: DC

## 2016-03-28 NOTE — Telephone Encounter (Signed)
Patient aware rx sent to pharmacy.  

## 2016-06-19 ENCOUNTER — Telehealth: Payer: Self-pay | Admitting: Family Medicine

## 2016-06-19 NOTE — Telephone Encounter (Signed)
Encourage to keep appointment Monday with provider to discuss hair loss.

## 2016-06-23 ENCOUNTER — Ambulatory Visit (INDEPENDENT_AMBULATORY_CARE_PROVIDER_SITE_OTHER): Payer: BLUE CROSS/BLUE SHIELD | Admitting: Family

## 2016-06-23 ENCOUNTER — Encounter: Payer: Self-pay | Admitting: Family

## 2016-06-23 VITALS — BP 136/82 | HR 81 | Temp 97.7°F | Ht 74.0 in | Wt 282.0 lb

## 2016-06-23 DIAGNOSIS — Z0289 Encounter for other administrative examinations: Secondary | ICD-10-CM

## 2016-06-23 DIAGNOSIS — Z1211 Encounter for screening for malignant neoplasm of colon: Secondary | ICD-10-CM | POA: Diagnosis not present

## 2016-06-23 DIAGNOSIS — E8881 Metabolic syndrome: Secondary | ICD-10-CM

## 2016-06-23 DIAGNOSIS — I10 Essential (primary) hypertension: Secondary | ICD-10-CM | POA: Diagnosis not present

## 2016-06-23 DIAGNOSIS — E669 Obesity, unspecified: Secondary | ICD-10-CM

## 2016-06-23 DIAGNOSIS — M542 Cervicalgia: Secondary | ICD-10-CM | POA: Diagnosis not present

## 2016-06-23 DIAGNOSIS — F1120 Opioid dependence, uncomplicated: Secondary | ICD-10-CM

## 2016-06-23 DIAGNOSIS — L989 Disorder of the skin and subcutaneous tissue, unspecified: Secondary | ICD-10-CM

## 2016-06-23 DIAGNOSIS — F17200 Nicotine dependence, unspecified, uncomplicated: Secondary | ICD-10-CM | POA: Diagnosis not present

## 2016-06-23 DIAGNOSIS — G8929 Other chronic pain: Secondary | ICD-10-CM

## 2016-06-23 DIAGNOSIS — F411 Generalized anxiety disorder: Secondary | ICD-10-CM

## 2016-06-23 DIAGNOSIS — L659 Nonscarring hair loss, unspecified: Secondary | ICD-10-CM | POA: Diagnosis not present

## 2016-06-23 DIAGNOSIS — M549 Dorsalgia, unspecified: Secondary | ICD-10-CM

## 2016-06-23 DIAGNOSIS — E782 Mixed hyperlipidemia: Secondary | ICD-10-CM | POA: Diagnosis not present

## 2016-06-23 MED ORDER — OXYCODONE-ACETAMINOPHEN 5-325 MG PO TABS: 1.0000 | ORAL_TABLET | Freq: Two times a day (BID) | ORAL | 0 refills | Status: DC | PRN

## 2016-06-23 NOTE — Progress Notes (Signed)
Subjective:    Patient ID: Austin Combs, male    DOB: Jun 08, 1966, 50 y.o.   MRN: [id number]  Pt presents to the office today for chronic follow up and pain medication refill.  Hypertension  This is a chronic problem. The current episode started more than 1 month ago. The problem has been resolved since onset. The problem is controlled. Associated symptoms include anxiety and neck pain. Pertinent negatives include no palpitations, peripheral edema or shortness of breath. Past treatments include angiotensin blockers. The current treatment provides significant improvement. There is no history of kidney disease, CAD/MI, CVA, heart failure or a thyroid problem. There is no history of sleep apnea.  Anxiety  Presents for follow-up visit. Onset was 1 to 5 years ago. The problem has been waxing and waning. Symptoms include excessive worry, irritability and nervous/anxious behavior. Patient reports no confusion, depressed mood, palpitations, panic or shortness of breath. Symptoms occur occasionally. The severity of symptoms is moderate. The symptoms are aggravated by family issues. The quality of sleep is good.   His past medical history is significant for anxiety/panic attacks. Past treatments include SSRIs. The treatment provided moderate relief. Compliance with prior treatments has been good.  Hyperlipidemia  This is a chronic problem. The current episode started more than 1 year ago. The problem is uncontrolled. Recent lipid tests were reviewed and are high. Exacerbating diseases include obesity. Pertinent negatives include no shortness of breath. Current antihyperlipidemic treatment includes statins and herbal therapy. The current treatment provides significant improvement of lipids. Risk factors for coronary artery disease include diabetes mellitus, dyslipidemia, male sex, obesity, hypertension, a sedentary lifestyle and family history.  Neck Pain   This is a chronic problem. The current episode  started more than 1 year ago. The problem occurs intermittently. The problem has been waxing and waning. The pain is present in the anterior neck. The quality of the pain is described as aching. The pain is at a severity of 3/10. The pain is moderate. Worse during: in morning. He has tried oral narcotics for the symptoms. The treatment provided moderate relief.  Metabolic Syndrome Pt obese and trying to be on low fat diet and exercise at times.  Skin lesion Pt has a skin lesion on right cheek. Had a derm app on 03/2016, but had to cancel it.   Pain assessment: Cause of pain- Chronic back and neck Pain on scale of 1-10-2 or 3 out 10 Frequency-Intermittently What increases pain-Mornings What makes pain Better-Moving around   Current medications- Oxycodone 5-325 mg  Effectiveness of current meds-Stable  Pill count performed-No Urine drug screen- Yes, 12/14/15 Was the [place] reviewed- Yes  If yes were their any concerning findings? -Has only received controlled medication with me   Review of Systems  Constitutional: Positive for irritability.  HENT: Negative.   Respiratory: Negative.  Negative for shortness of breath.   Cardiovascular: Negative for palpitations.  Gastrointestinal: Negative.   Endocrine: Negative.   Genitourinary: Negative.   Musculoskeletal: Positive for neck pain.  Neurological: Negative.   Hematological: Negative.   Psychiatric/Behavioral: Negative for confusion. The patient is nervous/anxious.   All other systems reviewed and are negative.      Objective:   Physical Exam  Constitutional: He is oriented to person, place, and time. He appears well-developed and well-nourished. No distress.  HENT:  Head: Normocephalic.  Right Ear: External ear normal.  Left Ear: External ear normal.  Nose: Nose normal.  Mouth/Throat: Oropharynx is clear and moist.  Patches of hair loss  on head  Eyes: Pupils are equal, round, and reactive to light. Right eye exhibits no  discharge. Left eye exhibits no discharge.  Neck: Normal range of motion. Neck supple. No thyromegaly present.  Cardiovascular: Normal rate, regular rhythm, normal heart sounds and intact distal pulses.   No murmur heard. Pulmonary/Chest: Effort normal and breath sounds normal. No respiratory distress. He has no wheezes.  Abdominal: Soft. Bowel sounds are normal. He exhibits no distension. There is no tenderness.  Musculoskeletal: Normal range of motion. He exhibits no edema or tenderness.  Neurological: He is alert and oriented to person, place, and time. He has normal reflexes. No cranial nerve deficit.  Skin: Skin is warm and dry. No rash noted. No erythema.  Lesion on right cheek  Psychiatric: He has a normal mood and affect. His behavior is normal. Judgment and thought content normal.  Vitals reviewed.   BP 136/82   Pulse 81   Temp 97.7 F (36.5 C) (Oral)   Ht 6' 2" (1.88 m)   Wt 282 lb (127.9 kg)   BMI 36.21 kg/m        Assessment & Plan:  1. Essential hypertension - CMP14+EGFR - CBC with Differential/Platelet  2. Chronic neck and back pain - oxyCODONE-acetaminophen (PERCOCET/ROXICET) 5-325 MG tablet; Take 1 tablet by mouth 2 (two) times daily as needed.  Dispense: 30 tablet; Refill: 0 - oxyCODONE-acetaminophen (ROXICET) 5-325 MG tablet; Take 1 tablet by mouth every 12 (twelve) hours as needed for severe pain.  Dispense: 30 tablet; Refill: 0 - oxyCODONE-acetaminophen (ROXICET) 5-325 MG tablet; Take 1 tablet by mouth every 12 (twelve) hours as needed for severe pain.  Dispense: 30 tablet; Refill: 0 - CMP14+EGFR - CBC with Differential/Platelet  3. Current smoker - CMP14+EGFR - CBC with Differential/Platelet  4. GAD (generalized anxiety disorder) - CMP14+EGFR - CBC with Differential/Platelet  5. Hyperlipemia, mixe - CMP14+EGFR - CBC with Differential/Platelet  6. Metabolic syndrome - YHC62+BJSE - CBC with Differential/Platelet  7. Obesity (BMI 35.0-39.9  without comorbidity) - CMP14+EGFR - CBC with Differential/Platelet  8. Uncomplicated opioid dependence ([place])  - oxyCODONE-acetaminophen (PERCOCET/ROXICET) 5-325 MG tablet; Take 1 tablet by mouth 2 (two) times daily as needed.  Dispense: 30 tablet; Refill: 0 - oxyCODONE-acetaminophen (ROXICET) 5-325 MG tablet; Take 1 tablet by mouth every 12 (twelve) hours as needed for severe pain.  Dispense: 30 tablet; Refill: 0 - oxyCODONE-acetaminophen (ROXICET) 5-325 MG tablet; Take 1 tablet by mouth every 12 (twelve) hours as needed for severe pain.  Dispense: 30 tablet; Refill: 0 - CMP14+EGFR - CBC with Differential/Platelet  9. Pain medication agreement signed - oxyCODONE-acetaminophen (PERCOCET/ROXICET) 5-325 MG tablet; Take 1 tablet by mouth 2 (two) times daily as needed.  Dispense: 30 tablet; Refill: 0 - oxyCODONE-acetaminophen (ROXICET) 5-325 MG tablet; Take 1 tablet by mouth every 12 (twelve) hours as needed for severe pain.  Dispense: 30 tablet; Refill: 0 - oxyCODONE-acetaminophen (ROXICET) 5-325 MG tablet; Take 1 tablet by mouth every 12 (twelve) hours as needed for severe pain.  Dispense: 30 tablet; Refill: 0 - CMP14+EGFR - CBC with Differential/Platelet  10. Skin lesion - Ambulatory referral to Dermatology - CMP14+EGFR - Lipid panel - CBC with Differential/Platelet  11. Hair loss -Alopecia?  - CMP14+EGFR - Thyroid Panel With TSH - CBC with Differential/Platelet  12. Colon cancer screening - CMP14+EGFR - Fecal occult blood, imunochemical; Future - CBC with Differential/Platelet   Continue all meds Labs pending Health Maintenance reviewed Diet and exercise encouraged RTO 3 months  Evelina Dun, FNP

## 2016-06-23 NOTE — Patient Instructions (Signed)
Alopecia Areata Alopecia areata is a type of hair loss. If you have this condition, you may lose hair on your scalp in patches. In some cases, you may lose all the hair on your scalp (alopecia totalis) or all the hair from your face and body (alopecia universalis).  Alopecia areata is an autoimmune disease. This means your body's defense system (immune system) mistakes normal parts of your body for germs or other things that can make you sick. When you have alopecia areata, your immune system attacks your hair follicles.  Alopecia areata often starts during childhood but can occur at any age. Alopecia areata is not a danger to your health but can be stressful.  CAUSES  The cause of alopecia areata is unknown.  RISK FACTORS You may be at higher risk of alopecia areata if you:   Have a family history of alopecia.  Have a family history of another autoimmune disease, including type 1 diabetes and rheumatoid arthritis. SIGNS AND SYMPTOMS Signs of alopecia areata may include:  Loss of scalp hair in small, round patches. These may be about the size of a quarter.  Loss of all hair on your scalp.  Loss of eyebrow hair, facial hair, or the hair inside your nose (nasal hair).  Hair loss over your entire body. DIAGNOSIS  Alopecia areata may be diagnosed by:  Medical history and physical exam.  Taking a sample of hair to check under a microscope.  Taking a small piece of skin (biopsy) to examine under a microscope.  Blood tests to rule out other autoimmune diseases. TREATMENT  There is no cure for alopecia areata, but the disease often goes away over time. You will not lose the ability to regrow hair. Some medicines may help your hair regrow more quickly. These include:  Corticosteroids. These block inflammation caused by your immune system. You may get this medicine as a lotion for your skin or as an injection.  Minoxidil. This is a hair growth medicine you can use in areas of hair  loss.  Anthralin. This is a medicine for a skin inflammation called psoriasis that may also help alopecia.  Diphencyprone. This medicine is applied to your skin and may stimulate hair growth. HOME CARE INSTRUCTIONS  Use sunscreen or cover your head when outdoors.  Take medicines only as directed by your health care provider.  If you have lost your eyebrows, wear sunglasses outside to keep dust out of your eyes.  If you have lost hair inside your nose, wear a kerchief over your face or apply ointment to the inside of your nose. This keeps out dust and other irritants.  Keep all follow-up visits as directed by your health care provider. This is important. SEEK MEDICAL CARE IF:  Your symptoms change.  You have new symptoms.  You have a reaction to your medicines.  You are struggling emotionally. This information is not intended to replace advice given to you by your health care provider. Make sure you discuss any questions you have with your health care provider. Document Released: 02/09/2004 Document Revised: 07/28/2014 Document Reviewed: 09/26/2013 Elsevier Interactive Patient Education  2017 Elsevier Inc.  

## 2016-06-24 LAB — CBC WITH DIFFERENTIAL/PLATELET
BASOS: 1 %
Basophils Absolute: 0.1 10*3/uL (ref 0.0–0.2)
EOS (ABSOLUTE): 0.3 10*3/uL (ref 0.0–0.4)
EOS: 2 %
HEMATOCRIT: 43.4 % (ref 37.5–51.0)
HEMOGLOBIN: 15.2 g/dL (ref 13.0–17.7)
IMMATURE GRANS (ABS): 0 10*3/uL (ref 0.0–0.1)
Immature Granulocytes: 0 %
LYMPHS: 37 %
Lymphocytes Absolute: 4.6 10*3/uL — ABNORMAL HIGH (ref 0.7–3.1)
MCH: 31.9 pg (ref 26.6–33.0)
MCHC: 35 g/dL (ref 31.5–35.7)
MCV: 91 fL (ref 79–97)
MONOCYTES: 6 %
Monocytes Absolute: 0.8 10*3/uL (ref 0.1–0.9)
NEUTROS ABS: 6.8 10*3/uL (ref 1.4–7.0)
Neutrophils: 54 %
Platelets: 303 10*3/uL (ref 150–379)
RBC: 4.76 x10E6/uL (ref 4.14–5.80)
RDW: 13.3 % (ref 12.3–15.4)
WBC: 12.6 10*3/uL — ABNORMAL HIGH (ref 3.4–10.8)

## 2016-06-24 LAB — LIPID PANEL
CHOL/HDL RATIO: 5.6 ratio — AB (ref 0.0–5.0)
Cholesterol, Total: 150 mg/dL (ref 100–199)
HDL: 27 mg/dL — ABNORMAL LOW (ref >39)
LDL CALC: 82 mg/dL (ref 0–99)
TRIGLYCERIDES: 204 mg/dL — AB (ref 0–149)
VLDL Cholesterol Cal: 41 mg/dL — ABNORMAL HIGH (ref 5–40)

## 2016-06-24 LAB — CMP14+EGFR
ALBUMIN: 4.2 g/dL (ref 3.5–5.5)
ALT: 20 IU/L (ref 0–44)
AST: 14 IU/L (ref 0–40)
Albumin/Globulin Ratio: 1.6 (ref 1.2–2.2)
Alkaline Phosphatase: 86 IU/L (ref 39–117)
BUN/Creatinine Ratio: 11 (ref 9–20)
BUN: 10 mg/dL (ref 6–24)
Bilirubin Total: 0.6 mg/dL (ref 0.0–1.2)
CALCIUM: 8.9 mg/dL (ref 8.7–10.2)
CO2: 25 mmol/L (ref 18–29)
CREATININE: 0.89 mg/dL (ref 0.76–1.27)
Chloride: 101 mmol/L (ref 96–106)
GFR calc Af Amer: 114 mL/min/{1.73_m2} (ref >59)
GFR, EST NON AFRICAN AMERICAN: 99 mL/min/{1.73_m2} (ref >59)
GLOBULIN, TOTAL: 2.7 g/dL (ref 1.5–4.5)
Glucose: 128 mg/dL — ABNORMAL HIGH (ref 65–99)
Potassium: 3.5 mmol/L (ref 3.5–5.2)
SODIUM: 140 mmol/L (ref 134–144)
Total Protein: 6.9 g/dL (ref 6.0–8.5)

## 2016-06-24 LAB — THYROID PANEL WITH TSH
Free Thyroxine Index: 1.7 (ref 1.2–4.9)
T3 UPTAKE RATIO: 22 % — AB (ref 24–39)
T4, Total: 7.6 ug/dL (ref 4.5–12.0)
TSH: 1.3 u[IU]/mL (ref 0.450–4.500)

## 2016-07-15 ENCOUNTER — Ambulatory Visit (INDEPENDENT_AMBULATORY_CARE_PROVIDER_SITE_OTHER): Payer: BLUE CROSS/BLUE SHIELD | Admitting: Nurse Practitioner

## 2016-07-15 ENCOUNTER — Encounter: Payer: Self-pay | Admitting: Nurse Practitioner

## 2016-07-15 VITALS — BP 138/92 | HR 90 | Temp 97.9°F | Ht 74.0 in | Wt 281.0 lb

## 2016-07-15 DIAGNOSIS — H9203 Otalgia, bilateral: Secondary | ICD-10-CM | POA: Diagnosis not present

## 2016-07-15 NOTE — Progress Notes (Signed)
   Subjective:    Patient ID: Austin Combs, male    DOB: [birth year], 50 y.o.   MRN: [id number]  HPI Patient comes in today c/o qtip stuck in ear- Happened a week ago- cannot get it out.    Review of Systems  Constitutional: Negative.   HENT: Negative.   Respiratory: Negative.   Cardiovascular: Negative.   Gastrointestinal: Negative.   Genitourinary: Negative.   Neurological: Negative.   Psychiatric/Behavioral: Negative.   All other systems reviewed and are negative.      Objective:   Physical Exam  Constitutional: He appears well-developed and well-nourished.  HENT:  Right Ear: No foreign bodies.  Left Ear: No foreign bodies.  Nose: Nose normal.  Mouth/Throat: Uvula is midline, oropharynx is clear and moist and mucous membranes are normal.  Cardiovascular: Normal rate, regular rhythm and normal heart sounds.   Pulmonary/Chest: Effort normal and breath sounds normal.  Neurological: He is alert.  Skin: Skin is warm.  Psychiatric: He has a normal mood and affect. His behavior is normal. Judgment and thought content normal.   BP (!) 138/92 (BP Location: Left Arm, Cuff Size: Normal)   Pulse 90   Temp 97.9 F (36.6 C) (Oral)   Ht 6\' 2"  (1.88 m)   Wt 281 lb (127.5 kg)   BMI 36.08 kg/m        Assessment & Plan:   1. Otalgia of both ears    Debrox OTC and use 2x a week Do not use q tip I nears RTO prn  Mary-Margaret Daphine DeutscherMartin, FNP

## 2016-07-15 NOTE — Patient Instructions (Signed)
Earache, Adult An earache, or ear pain, can be caused by many things, including:  An infection.  Ear wax buildup.  Ear pressure.  Something in the ear that should not be there (foreign body).  A sore throat.  Tooth problems.  Jaw problems. Treatment of the earache will depend on the cause. If the cause is not clear or cannot be determined, you may need to watch your symptoms until your earache goes away or until a cause is found. Follow these instructions at home: Pay attention to any changes in your symptoms. Take these actions to help with your pain:  Take or apply over-the-counter and prescription medicines only as told by your health care provider.  If you were prescribed an antibiotic medicine, use it as told by your health care provider. Do not stop using the antibiotic even if you start to feel better.  Do not put anything in your ear other than medicine that is prescribed by your health care provider.  If directed, apply heat to the affected area as often as told by your health care provider. Use the heat source that your health care provider recommends, such as a moist heat pack or a heating pad.  Place a towel between your skin and the heat source.  Leave the heat on for 20-30 minutes.  Remove the heat if your skin turns bright red. This is especially important if you are unable to feel pain, heat, or cold. You may have a greater risk of getting burned.  If directed, put ice on the ear:  Put ice in a plastic bag.  Place a towel between your skin and the bag.  Leave the ice on for 20 minutes, 2-3 times a day.  Try resting in an upright position instead of lying down. This may help to reduce pressure in your ear and relieve pain.  Chew gum if it helps to relieve your ear pain.  Treat any allergies as told by your health care provider.  Keep all follow-up visits as told by your health care provider. This is important. Contact a health care provider if:  Your  pain does not improve within 2 days.  Your earache gets worse.  You have new symptoms.  You have a fever. Get help right away if:  You have a severe headache.  You have a stiff neck.  You have trouble swallowing.  You have redness or swelling behind your ear.  You have fluid or blood coming from your ear.  You have hearing loss.  You feel dizzy. This information is not intended to replace advice given to you by your health care provider. Make sure you discuss any questions you have with your health care provider. Document Released: 02/22/2004 Document Revised: 03/04/2016 Document Reviewed: 12/31/2015 Elsevier Interactive Patient Education  2017 Elsevier Inc.  

## 2016-08-11 ENCOUNTER — Other Ambulatory Visit: Payer: Self-pay | Admitting: Family

## 2016-09-25 ENCOUNTER — Ambulatory Visit (INDEPENDENT_AMBULATORY_CARE_PROVIDER_SITE_OTHER): Payer: BLUE CROSS/BLUE SHIELD | Admitting: Family

## 2016-09-25 ENCOUNTER — Encounter: Payer: Self-pay | Admitting: Family

## 2016-09-25 VITALS — BP 133/80 | HR 90 | Temp 97.6°F | Ht 74.0 in | Wt 288.6 lb

## 2016-09-25 DIAGNOSIS — E8881 Metabolic syndrome: Secondary | ICD-10-CM

## 2016-09-25 DIAGNOSIS — M549 Dorsalgia, unspecified: Secondary | ICD-10-CM | POA: Diagnosis not present

## 2016-09-25 DIAGNOSIS — Z1211 Encounter for screening for malignant neoplasm of colon: Secondary | ICD-10-CM | POA: Diagnosis not present

## 2016-09-25 DIAGNOSIS — M542 Cervicalgia: Secondary | ICD-10-CM | POA: Diagnosis not present

## 2016-09-25 DIAGNOSIS — F17200 Nicotine dependence, unspecified, uncomplicated: Secondary | ICD-10-CM

## 2016-09-25 DIAGNOSIS — Z0289 Encounter for other administrative examinations: Secondary | ICD-10-CM | POA: Diagnosis not present

## 2016-09-25 DIAGNOSIS — F411 Generalized anxiety disorder: Secondary | ICD-10-CM

## 2016-09-25 DIAGNOSIS — Z23 Encounter for immunization: Secondary | ICD-10-CM

## 2016-09-25 DIAGNOSIS — E782 Mixed hyperlipidemia: Secondary | ICD-10-CM

## 2016-09-25 DIAGNOSIS — R7301 Impaired fasting glucose: Secondary | ICD-10-CM

## 2016-09-25 DIAGNOSIS — G8929 Other chronic pain: Secondary | ICD-10-CM

## 2016-09-25 DIAGNOSIS — F1120 Opioid dependence, uncomplicated: Secondary | ICD-10-CM | POA: Diagnosis not present

## 2016-09-25 DIAGNOSIS — E669 Obesity, unspecified: Secondary | ICD-10-CM

## 2016-09-25 DIAGNOSIS — I10 Essential (primary) hypertension: Secondary | ICD-10-CM

## 2016-09-25 MED ORDER — OXYCODONE-ACETAMINOPHEN 5-325 MG PO TABS: 1.0000 | ORAL_TABLET | Freq: Two times a day (BID) | ORAL | 0 refills | Status: DC | PRN

## 2016-09-25 NOTE — Patient Instructions (Signed)
 Health Maintenance, Male A healthy lifestyle and preventive care is important for your health and wellness. Ask your health care provider about what schedule of regular examinations is right for you. What should I know about weight and diet?  Eat a Healthy Diet  Eat plenty of vegetables, fruits, whole grains, low-fat dairy products, and lean protein.  Do not eat a lot of foods high in solid fats, added sugars, or salt. Maintain a Healthy Weight  Regular exercise can help you achieve or maintain a healthy weight. You should:  Do at least 150 minutes of exercise each week. The exercise should increase your heart rate and make you sweat (moderate-intensity exercise).  Do strength-training exercises at least twice a week. Watch Your Levels of Cholesterol and Blood Lipids  Have your blood tested for lipids and cholesterol every 5 years starting at 52 years of age. If you are at high risk for heart disease, you should start having your blood tested when you are 52 years old. You may need to have your cholesterol levels checked more often if:  Your lipid or cholesterol levels are high.  You are older than 52 years of age.  You are at high risk for heart disease. What should I know about cancer screening? Many types of cancers can be detected early and may often be prevented. Lung Cancer  You should be screened every year for lung cancer if:  You are a current smoker who has smoked for at least 30 years.  You are a former smoker who has quit within the past 15 years.  Talk to your health care provider about your screening options, when you should start screening, and how often you should be screened. Colorectal Cancer  Routine colorectal cancer screening usually begins at 52 years of age and should be repeated every 5-10 years until you are 52 years old. You may need to be screened more often if early forms of precancerous polyps or small growths are found. Your health care provider  may recommend screening at an earlier age if you have risk factors for colon cancer.  Your health care provider may recommend using home test kits to check for hidden blood in the stool.  A small camera at the end of a tube can be used to examine your colon (sigmoidoscopy or colonoscopy). This checks for the earliest forms of colorectal cancer. Prostate and Testicular Cancer  Depending on your age and overall health, your health care provider may do certain tests to screen for prostate and testicular cancer.  Talk to your health care provider about any symptoms or concerns you have about testicular or prostate cancer. Skin Cancer  Check your skin from head to toe regularly.  Tell your health care provider about any new moles or changes in moles, especially if:  There is a change in a mole's size, shape, or color.  You have a mole that is larger than a pencil eraser.  Always use sunscreen. Apply sunscreen liberally and repeat throughout the day.  Protect yourself by wearing long sleeves, pants, a wide-brimmed hat, and sunglasses when outside. What should I know about heart disease, diabetes, and high blood pressure?  If you are 18-39 years of age, have your blood pressure checked every 3-5 years. If you are 40 years of age or older, have your blood pressure checked every year. You should have your blood pressure measured twice-once when you are at a hospital or clinic, and once when you are not at   a hospital or clinic. Record the average of the two measurements. To check your blood pressure when you are not at a hospital or clinic, you can use:  An automated blood pressure machine at a pharmacy.  A home blood pressure monitor.  Talk to your health care provider about your target blood pressure.  If you are between 45-79 years old, ask your health care provider if you should take aspirin to prevent heart disease.  Have regular diabetes screenings by checking your fasting blood sugar  level.  If you are at a normal weight and have a low risk for diabetes, have this test once every three years after the age of 45.  If you are overweight and have a high risk for diabetes, consider being tested at a younger age or more often.  A one-time screening for abdominal aortic aneurysm (AAA) by ultrasound is recommended for men aged 65-75 years who are current or former smokers. What should I know about preventing infection? Hepatitis B  If you have a higher risk for hepatitis B, you should be screened for this virus. Talk with your health care provider to find out if you are at risk for hepatitis B infection. Hepatitis C  Blood testing is recommended for:  Everyone born from 1945 through 1965.  Anyone with known risk factors for hepatitis C. Sexually Transmitted Diseases (STDs)  You should be screened each year for STDs including gonorrhea and chlamydia if:  You are sexually active and are younger than 52 years of age.  You are older than 52 years of age and your health care provider tells you that you are at risk for this type of infection.  Your sexual activity has changed since you were last screened and you are at an increased risk for chlamydia or gonorrhea. Ask your health care provider if you are at risk.  Talk with your health care provider about whether you are at high risk of being infected with HIV. Your health care provider may recommend a prescription medicine to help prevent HIV infection. What else can I do?  Schedule regular health, dental, and eye exams.  Stay current with your vaccines (immunizations).  Do not use any tobacco products, such as cigarettes, chewing tobacco, and e-cigarettes. If you need help quitting, ask your health care provider.  Limit alcohol intake to no more than 2 drinks per day. One drink equals 12 ounces of beer, 5 ounces of wine, or 1 ounces of hard liquor.  Do not use street drugs.  Do not share needles.  Ask your health  care provider for help if you need support or information about quitting drugs.  Tell your health care provider if you often feel depressed.  Tell your health care provider if you have ever been abused or do not feel safe at home. This information is not intended to replace advice given to you by your health care provider. Make sure you discuss any questions you have with your health care provider. Document Released: 01/03/2008 Document Revised: 03/05/2016 Document Reviewed: 04/10/2015 Elsevier Interactive Patient Education  2017 Elsevier Inc.  

## 2016-09-25 NOTE — Progress Notes (Signed)
Subjective:    Patient ID: Austin Combs, male    DOB: August 09, 1965, 51 y.o.   MRN: [id number]  Pt presents to the office today for chronic follow up and pain medication refill.  Hypertension  This is a chronic problem. The current episode started more than 1 month ago. The problem has been resolved since onset. The problem is controlled. Associated symptoms include anxiety and neck pain. Pertinent negatives include no palpitations, peripheral edema or shortness of breath. Past treatments include angiotensin blockers. The current treatment provides significant improvement. There is no history of kidney disease, CAD/MI, CVA or heart failure. There is no history of sleep apnea or a thyroid problem.  Hyperlipidemia  This is a chronic problem. The current episode started more than 1 year ago. The problem is controlled. Recent lipid tests were reviewed and are normal. Exacerbating diseases include obesity. Pertinent negatives include no shortness of breath. Current antihyperlipidemic treatment includes statins and herbal therapy. The current treatment provides significant improvement of lipids. Risk factors for coronary artery disease include diabetes mellitus, dyslipidemia, male sex, obesity, hypertension, a sedentary lifestyle and family history.  Anxiety  Presents for follow-up visit. Onset was 1 to 5 years ago. The problem has been waxing and waning. Symptoms include excessive worry, irritability and nervous/anxious behavior. Patient reports no confusion, depressed mood, palpitations, panic or shortness of breath. Symptoms occur occasionally. The severity of symptoms is moderate. The symptoms are aggravated by family issues. The quality of sleep is good.   His past medical history is significant for anxiety/panic attacks. Past treatments include SSRIs. The treatment provided moderate relief. Compliance with prior treatments has been good.  Neck Pain   This is a chronic problem. The current episode  started more than 1 year ago. The problem occurs intermittently. The problem has been waxing and waning. The pain is present in the anterior neck. The quality of the pain is described as aching. The pain is at a severity of 7/10. The pain is moderate. Worse during: in morning. He has tried oral narcotics for the symptoms. The treatment provided moderate relief.  Metabolic Syndrome Pt obese and trying to be on low fat diet and exercise at times.    Pain assessment: Cause of pain- Chronic back and neck Pain on scale of 1-10-2 or 3 out 10 Frequency-Intermittently What increases pain-Mornings What makes pain Better-Moving around   Current medications- Oxycodone 5-325 mg  Effectiveness of current meds-Stable  Pill count performed-No Urine drug screen- Yes, 12/14/15 Was the [place] reviewed- Yes  If yes were their any concerning findings? -Has only received controlled medication with me   Review of Systems  Constitutional: Positive for irritability.  HENT: Negative.   Respiratory: Negative.  Negative for shortness of breath.   Cardiovascular: Negative for palpitations.  Gastrointestinal: Negative.   Endocrine: Negative.   Genitourinary: Negative.   Musculoskeletal: Positive for neck pain.  Neurological: Negative.   Hematological: Negative.   Psychiatric/Behavioral: Negative for confusion. The patient is nervous/anxious.   All other systems reviewed and are negative.      Objective:   Physical Exam  Constitutional: He is oriented to person, place, and time. He appears well-developed and well-nourished. No distress.  HENT:  Head: Normocephalic.  Right Ear: External ear normal.  Left Ear: External ear normal.  Nose: Nose normal.  Mouth/Throat: Oropharynx is clear and moist.  Eyes: Pupils are equal, round, and reactive to light. Right eye exhibits no discharge. Left eye exhibits no discharge.  Neck: Normal range of  motion. Neck supple. No thyromegaly present.  Cardiovascular:  Normal rate, regular rhythm, normal heart sounds and intact distal pulses.   No murmur heard. Pulmonary/Chest: Effort normal and breath sounds normal. No respiratory distress. He has no wheezes.  Abdominal: Soft. Bowel sounds are normal. He exhibits no distension. There is no tenderness.  Musculoskeletal: Normal range of motion. He exhibits no edema or tenderness.  Neurological: He is alert and oriented to person, place, and time. He has normal reflexes. No cranial nerve deficit.  Skin: Skin is warm and dry. No rash noted. No erythema.  Psychiatric: He has a normal mood and affect. His behavior is normal. Judgment and thought content normal.  Vitals reviewed.   BP 133/80   Pulse 90   Temp 97.6 F (36.4 C) (Oral)   Ht '6\' 2"'  (1.88 m)   Wt 288 lb 9.6 oz (130.9 kg)   BMI 37.05 kg/m       Assessment & Plan:  1. Essential hypertension - CMP14+EGFR  2. Chronic neck and back pain - oxyCODONE-acetaminophen (PERCOCET/ROXICET) 5-325 MG tablet; Take 1 tablet by mouth 2 (two) times daily as needed.  Dispense: 30 tablet; Refill: 0 - oxyCODONE-acetaminophen (ROXICET) 5-325 MG tablet; Take 1 tablet by mouth every 12 (twelve) hours as needed for severe pain.  Dispense: 30 tablet; Refill: 0 - oxyCODONE-acetaminophen (ROXICET) 5-325 MG tablet; Take 1 tablet by mouth every 12 (twelve) hours as needed for severe pain.  Dispense: 30 tablet; Refill: 0 - CMP14+EGFR  3. Current smoker - CMP14+EGFR  4. GAD (generalized anxiety disorder) - CMP14+EGFR  5. Hyperlipemia, mixed - CMP14+EGFR - Lipid panel  6. Pain medication agreement signed - oxyCODONE-acetaminophen (PERCOCET/ROXICET) 5-325 MG tablet; Take 1 tablet by mouth 2 (two) times daily as needed.  Dispense: 30 tablet; Refill: 0 - oxyCODONE-acetaminophen (ROXICET) 5-325 MG tablet; Take 1 tablet by mouth every 12 (twelve) hours as needed for severe pain.  Dispense: 30 tablet; Refill: 0 - oxyCODONE-acetaminophen (ROXICET) 5-325 MG tablet; Take 1  tablet by mouth every 12 (twelve) hours as needed for severe pain.  Dispense: 30 tablet; Refill: 0 - CMP14+EGFR  7. Uncomplicated opioid dependence ([place]) - oxyCODONE-acetaminophen (PERCOCET/ROXICET) 5-325 MG tablet; Take 1 tablet by mouth 2 (two) times daily as needed.  Dispense: 30 tablet; Refill: 0 - oxyCODONE-acetaminophen (ROXICET) 5-325 MG tablet; Take 1 tablet by mouth every 12 (twelve) hours as needed for severe pain.  Dispense: 30 tablet; Refill: 0 - oxyCODONE-acetaminophen (ROXICET) 5-325 MG tablet; Take 1 tablet by mouth every 12 (twelve) hours as needed for severe pain.  Dispense: 30 tablet; Refill: 0 - CMP14+EGFR  8. Obesity (BMI 35.0-39.9 without comorbidity) - OVP03+EKBT  9. Metabolic syndrome - CYE18+HTMB  10. Colon cancer screening - Fecal occult blood, imunochemical; Future   Continue all meds Labs pending Health Maintenance reviewed-TDAP given today Diet and exercise encouraged RTO 3  months  Evelina Dun, FNP

## 2016-09-25 NOTE — Addendum Note (Signed)
Addended by: Almeta MonasSTONE, Abdi Husak M on: 09/25/2016 03:42 PM   Modules accepted: Orders

## 2016-09-26 ENCOUNTER — Other Ambulatory Visit: Payer: Self-pay

## 2016-09-26 DIAGNOSIS — R7301 Impaired fasting glucose: Secondary | ICD-10-CM

## 2016-09-26 LAB — CMP14+EGFR
ALBUMIN: 4.3 g/dL (ref 3.5–5.5)
ALK PHOS: 100 IU/L (ref 39–117)
ALT: 28 IU/L (ref 0–44)
AST: 15 IU/L (ref 0–40)
Albumin/Globulin Ratio: 1.9 (ref 1.2–2.2)
BILIRUBIN TOTAL: 0.3 mg/dL (ref 0.0–1.2)
BUN / CREAT RATIO: 14 (ref 9–20)
BUN: 10 mg/dL (ref 6–24)
CHLORIDE: 100 mmol/L (ref 96–106)
CO2: 25 mmol/L (ref 18–29)
CREATININE: 0.7 mg/dL — AB (ref 0.76–1.27)
Calcium: 8.8 mg/dL (ref 8.7–10.2)
GFR calc Af Amer: 126 mL/min/{1.73_m2} (ref >59)
GFR calc non Af Amer: 109 mL/min/{1.73_m2} (ref >59)
Globulin, Total: 2.3 g/dL (ref 1.5–4.5)
Glucose: 136 mg/dL — ABNORMAL HIGH (ref 65–99)
Potassium: 3.7 mmol/L (ref 3.5–5.2)
Sodium: 141 mmol/L (ref 134–144)
TOTAL PROTEIN: 6.6 g/dL (ref 6.0–8.5)

## 2016-09-26 LAB — LIPID PANEL
CHOL/HDL RATIO: 7.9 ratio — AB (ref 0.0–5.0)
Cholesterol, Total: 189 mg/dL (ref 100–199)
HDL: 24 mg/dL — ABNORMAL LOW (ref >39)
LDL CALC: 85 mg/dL (ref 0–99)
TRIGLYCERIDES: 400 mg/dL — AB (ref 0–149)
VLDL CHOLESTEROL CAL: 80 mg/dL — AB (ref 5–40)

## 2016-09-26 LAB — BAYER DCA HB A1C WAIVED: HB A1C (BAYER DCA - WAIVED): 6.4 % (ref <7.0)

## 2016-09-26 NOTE — Addendum Note (Signed)
Addended by: Prescott GumLAND, Seichi Kaufhold M on: 09/26/2016 02:15 PM   Modules accepted: Orders

## 2016-09-29 ENCOUNTER — Other Ambulatory Visit: Payer: Self-pay | Admitting: Family

## 2016-09-29 MED ORDER — METFORMIN HCL ER 500 MG PO TB24: 500.0000 mg | ORAL_TABLET | Freq: Every day | ORAL | 1 refills | Status: DC

## 2017-02-02 ENCOUNTER — Encounter: Payer: Self-pay | Admitting: Family

## 2017-02-02 ENCOUNTER — Ambulatory Visit (INDEPENDENT_AMBULATORY_CARE_PROVIDER_SITE_OTHER): Payer: BLUE CROSS/BLUE SHIELD | Admitting: Family

## 2017-02-02 VITALS — BP 142/91 | HR 97 | Temp 98.6°F | Ht 74.0 in | Wt 284.0 lb

## 2017-02-02 DIAGNOSIS — F17200 Nicotine dependence, unspecified, uncomplicated: Secondary | ICD-10-CM

## 2017-02-02 DIAGNOSIS — M549 Dorsalgia, unspecified: Secondary | ICD-10-CM

## 2017-02-02 DIAGNOSIS — F1120 Opioid dependence, uncomplicated: Secondary | ICD-10-CM | POA: Diagnosis not present

## 2017-02-02 DIAGNOSIS — F411 Generalized anxiety disorder: Secondary | ICD-10-CM | POA: Diagnosis not present

## 2017-02-02 DIAGNOSIS — G8929 Other chronic pain: Secondary | ICD-10-CM

## 2017-02-02 DIAGNOSIS — E119 Type 2 diabetes mellitus without complications: Secondary | ICD-10-CM | POA: Insufficient documentation

## 2017-02-02 DIAGNOSIS — E118 Type 2 diabetes mellitus with unspecified complications: Secondary | ICD-10-CM

## 2017-02-02 DIAGNOSIS — E782 Mixed hyperlipidemia: Secondary | ICD-10-CM

## 2017-02-02 DIAGNOSIS — Z0289 Encounter for other administrative examinations: Secondary | ICD-10-CM | POA: Diagnosis not present

## 2017-02-02 DIAGNOSIS — I10 Essential (primary) hypertension: Secondary | ICD-10-CM | POA: Diagnosis not present

## 2017-02-02 DIAGNOSIS — E669 Obesity, unspecified: Secondary | ICD-10-CM | POA: Diagnosis not present

## 2017-02-02 DIAGNOSIS — M542 Cervicalgia: Secondary | ICD-10-CM | POA: Diagnosis not present

## 2017-02-02 LAB — BAYER DCA HB A1C WAIVED: HB A1C (BAYER DCA - WAIVED): 6.6 % (ref <7.0)

## 2017-02-02 MED ORDER — OXYCODONE-ACETAMINOPHEN 5-325 MG PO TABS: 1.0000 | ORAL_TABLET | Freq: Two times a day (BID) | ORAL | 0 refills | Status: DC | PRN

## 2017-02-02 MED ORDER — ATORVASTATIN CALCIUM 10 MG PO TABS: ORAL_TABLET | ORAL | 1 refills | Status: DC

## 2017-02-02 MED ORDER — LOSARTAN POTASSIUM 100 MG PO TABS: ORAL_TABLET | ORAL | 1 refills | Status: DC

## 2017-02-02 MED ORDER — METFORMIN HCL ER 500 MG PO TB24: 500.0000 mg | ORAL_TABLET | Freq: Every day | ORAL | 1 refills | Status: DC

## 2017-02-02 MED ORDER — HYDROXYZINE HCL 25 MG PO TABS: 25.0000 mg | ORAL_TABLET | Freq: Three times a day (TID) | ORAL | 0 refills | Status: DC | PRN

## 2017-02-02 MED ORDER — ICOSAPENT ETHYL 1 G PO CAPS: 2000.0000 mg | ORAL_CAPSULE | Freq: Two times a day (BID) | ORAL | 6 refills | Status: DC

## 2017-02-02 NOTE — Patient Instructions (Signed)

## 2017-02-02 NOTE — Progress Notes (Signed)
Subjective:    Patient ID: Austin Combs, male    DOB: 24-Dec-1965, 50 y.o.   MRN: [id number]  Pt presents to the office today for chronic follow up and pain medication refill. Pt states he had trouble with his insurance and has not had his medications regularly over the last few weeks.  Hypertension  This is a chronic problem. The current episode started more than 1 year ago. The problem has been waxing and waning since onset. The problem is uncontrolled. Associated symptoms include anxiety. Pertinent negatives include no blurred vision, peripheral edema or shortness of breath. Risk factors for coronary artery disease include dyslipidemia, obesity, sedentary lifestyle and smoking/tobacco exposure. There is no history of kidney disease, CAD/MI, CVA or heart failure.  Hyperlipidemia  This is a chronic problem. The current episode started more than 1 year ago. The problem is controlled. Recent lipid tests were reviewed and are normal. Exacerbating diseases include obesity. Pertinent negatives include no shortness of breath. Current antihyperlipidemic treatment includes statins. The current treatment provides moderate improvement of lipids.  Anxiety  Presents for follow-up visit. Symptoms include depressed mood, excessive worry, irritability, nervous/anxious behavior and panic. Patient reports no shortness of breath. Symptoms occur occasionally.    Back Pain  This is a chronic problem. The current episode started more than 1 year ago. The problem occurs intermittently. The problem has been waxing and waning since onset. The pain is present in the lumbar spine. The quality of the pain is described as aching. The pain is at a severity of 4/10. The pain is moderate. He has tried analgesics and bed rest for the symptoms. The treatment provided moderate relief.  Diabetes  He has type 2 diabetes mellitus. Hypoglycemia symptoms include nervousness/anxiousness. Associated symptoms include fatigue. Pertinent  negatives for diabetes include no blurred vision, no foot paresthesias and no foot ulcerations. Symptoms are stable. Pertinent negatives for diabetic complications include no CVA. Risk factors for coronary artery disease include diabetes mellitus, dyslipidemia, male sex, obesity and post-menopausal. (Does not check BS at home )      Review of Systems  Constitutional: Positive for fatigue and irritability.  Eyes: Negative for blurred vision.  Respiratory: Negative for shortness of breath.   Musculoskeletal: Positive for back pain.  Psychiatric/Behavioral: The patient is nervous/anxious.   All other systems reviewed and are negative.      Objective:   Physical Exam  Constitutional: He is oriented to person, place, and time. He appears well-developed and well-nourished. No distress.  HENT:  Head: Normocephalic.  Right Ear: External ear normal.  Left Ear: External ear normal.  Nose: Nose normal.  Mouth/Throat: Oropharynx is clear and moist.  Eyes: Pupils are equal, round, and reactive to light. Right eye exhibits no discharge. Left eye exhibits no discharge.  Neck: Normal range of motion. Neck supple. No thyromegaly present.  Cardiovascular: Normal rate, regular rhythm, normal heart sounds and intact distal pulses.   No murmur heard. Pulmonary/Chest: Effort normal and breath sounds normal. No respiratory distress. He has no wheezes.  Abdominal: Soft. Bowel sounds are normal. He exhibits no distension. There is no tenderness.  Musculoskeletal: Normal range of motion. He exhibits no edema or tenderness.  Neurological: He is alert and oriented to person, place, and time. He has normal reflexes. No cranial nerve deficit.  Skin: Skin is warm and dry. No rash noted. No erythema.  Psychiatric: He has a normal mood and affect. His behavior is normal. Judgment and thought content normal.  Vitals reviewed.  BP (!) 142/91   Pulse 97   Temp 98.6 F (37 C) (Oral)   Ht _0  (1.88 m)   Wt  284 lb (128.8 kg)   BMI 36.46 kg/m      Assessment & Plan:  1. Essential hypertension -PT restart medications  - CMP14+EGFR - losartan (COZAAR) 100 MG tablet; TAKE 1 TABLET (100 MG TOTAL) BY MOUTH DAILY.  Dispense: 90 tablet; Refill: 1  2. Uncomplicated opioid dependence ([place]) - CMP14+EGFR - oxyCODONE-acetaminophen (PERCOCET/ROXICET) 5-325 MG tablet; Take 1 tablet by mouth 2 (two) times daily as needed.  Dispense: 30 tablet; Refill: 0 - oxyCODONE-acetaminophen (ROXICET) 5-325 MG tablet; Take 1 tablet by mouth every 12 (twelve) hours as needed for severe pain.  Dispense: 30 tablet; Refill: 0 - oxyCODONE-acetaminophen (ROXICET) 5-325 MG tablet; Take 1 tablet by mouth every 12 (twelve) hours as needed for severe pain.  Dispense: 30 tablet; Refill: 0  3. Pain medication agreement signed - CMP14+EGFR - oxyCODONE-acetaminophen (PERCOCET/ROXICET) 5-325 MG tablet; Take 1 tablet by mouth 2 (two) times daily as needed.  Dispense: 30 tablet; Refill: 0 - oxyCODONE-acetaminophen (ROXICET) 5-325 MG tablet; Take 1 tablet by mouth every 12 (twelve) hours as needed for severe pain.  Dispense: 30 tablet; Refill: 0 - oxyCODONE-acetaminophen (ROXICET) 5-325 MG tablet; Take 1 tablet by mouth every 12 (twelve) hours as needed for severe pain.  Dispense: 30 tablet; Refill: 0  4. Obesity (BMI 35.0-39.9 without comorbidity) - CMP14+EGFR  5. Hyperlipemia, mixed - CMP14+EGFR - Lipid panel - atorvastatin (LIPITOR) 10 MG tablet; TAKE 1 TABLET (10 MG TOTAL) BY MOUTH DAILY.  Dispense: 90 tablet; Refill: 1 - Icosapent Ethyl (VASCEPA) 1 g CAPS; Take 2,000 mg by mouth 2 (two) times daily.  Dispense: 120 capsule; Refill: 6  6. GAD (generalized anxiety disorder) - CMP14+EGFR - hydrOXYzine (ATARAX/VISTARIL) 25 MG tablet; Take 1 tablet (25 mg total) by mouth 3 (three) times daily as needed for anxiety.  Dispense: 30 tablet; Refill: 0  7. Current smoker Smoking cessation discussed  - CMP14+EGFR  8. Chronic neck and  back pain - CMP14+EGFR - oxyCODONE-acetaminophen (PERCOCET/ROXICET) 5-325 MG tablet; Take 1 tablet by mouth 2 (two) times daily as needed.  Dispense: 30 tablet; Refill: 0 - oxyCODONE-acetaminophen (ROXICET) 5-325 MG tablet; Take 1 tablet by mouth every 12 (twelve) hours as needed for severe pain.  Dispense: 30 tablet; Refill: 0 - oxyCODONE-acetaminophen (ROXICET) 5-325 MG tablet; Take 1 tablet by mouth every 12 (twelve) hours as needed for severe pain.  Dispense: 30 tablet; Refill: 0  9. Type 2 diabetes mellitus with complication, without long-term current use of insulin (HCC) - CMP14+EGFR - metFORMIN (GLUCOPHAGE-XR) 500 MG 24 hr tablet; Take 1 tablet (500 mg total) by mouth daily with breakfast.  Dispense: 90 tablet; Refill: 1 - Bayer DCA Hb A1c Waived  *Pt reviewed in  controlled Database- Pt has only received controlled   from me.   Continue all meds Labs pending Health Maintenance reviewed Diet and exercise encouraged RTO 3 months   Evelina Dun, FNP

## 2017-02-03 ENCOUNTER — Other Ambulatory Visit: Payer: Self-pay | Admitting: Family

## 2017-02-03 LAB — LIPID PANEL
CHOL/HDL RATIO: 7.4 ratio — AB (ref 0.0–5.0)
Cholesterol, Total: 193 mg/dL (ref 100–199)
HDL: 26 mg/dL — AB (ref >39)
LDL CALC: 101 mg/dL — AB (ref 0–99)
Triglycerides: 329 mg/dL — ABNORMAL HIGH (ref 0–149)
VLDL CHOLESTEROL CAL: 66 mg/dL — AB (ref 5–40)

## 2017-02-03 LAB — CMP14+EGFR
ALBUMIN: 4.5 g/dL (ref 3.5–5.5)
ALT: 30 IU/L (ref 0–44)
AST: 16 IU/L (ref 0–40)
Albumin/Globulin Ratio: 2 (ref 1.2–2.2)
Alkaline Phosphatase: 90 IU/L (ref 39–117)
BILIRUBIN TOTAL: 0.3 mg/dL (ref 0.0–1.2)
BUN / CREAT RATIO: 11 (ref 9–20)
BUN: 9 mg/dL (ref 6–24)
CALCIUM: 9.1 mg/dL (ref 8.7–10.2)
CO2: 20 mmol/L (ref 20–29)
CREATININE: 0.85 mg/dL (ref 0.76–1.27)
Chloride: 105 mmol/L (ref 96–106)
GFR, EST AFRICAN AMERICAN: 117 mL/min/{1.73_m2} (ref >59)
GFR, EST NON AFRICAN AMERICAN: 101 mL/min/{1.73_m2} (ref >59)
GLUCOSE: 130 mg/dL — AB (ref 65–99)
Globulin, Total: 2.3 g/dL (ref 1.5–4.5)
Potassium: 4.6 mmol/L (ref 3.5–5.2)
Sodium: 141 mmol/L (ref 134–144)
TOTAL PROTEIN: 6.8 g/dL (ref 6.0–8.5)

## 2017-02-03 MED ORDER — ATORVASTATIN CALCIUM 20 MG PO TABS: 20.0000 mg | ORAL_TABLET | Freq: Every day | ORAL | 3 refills | Status: DC

## 2017-05-14 ENCOUNTER — Ambulatory Visit (INDEPENDENT_AMBULATORY_CARE_PROVIDER_SITE_OTHER): Payer: BLUE CROSS/BLUE SHIELD | Admitting: Family

## 2017-05-14 ENCOUNTER — Encounter: Payer: Self-pay | Admitting: Family

## 2017-05-14 VITALS — BP 152/93 | HR 100 | Temp 98.0°F | Ht 74.0 in | Wt 284.2 lb

## 2017-05-14 DIAGNOSIS — E785 Hyperlipidemia, unspecified: Secondary | ICD-10-CM

## 2017-05-14 DIAGNOSIS — E1169 Type 2 diabetes mellitus with other specified complication: Secondary | ICD-10-CM

## 2017-05-14 DIAGNOSIS — M542 Cervicalgia: Secondary | ICD-10-CM

## 2017-05-14 DIAGNOSIS — I10 Essential (primary) hypertension: Secondary | ICD-10-CM

## 2017-05-14 DIAGNOSIS — M549 Dorsalgia, unspecified: Secondary | ICD-10-CM

## 2017-05-14 DIAGNOSIS — E1159 Type 2 diabetes mellitus with other circulatory complications: Secondary | ICD-10-CM

## 2017-05-14 DIAGNOSIS — Z0289 Encounter for other administrative examinations: Secondary | ICD-10-CM

## 2017-05-14 DIAGNOSIS — F411 Generalized anxiety disorder: Secondary | ICD-10-CM

## 2017-05-14 DIAGNOSIS — E8881 Metabolic syndrome: Secondary | ICD-10-CM | POA: Diagnosis not present

## 2017-05-14 DIAGNOSIS — E1165 Type 2 diabetes mellitus with hyperglycemia: Secondary | ICD-10-CM

## 2017-05-14 DIAGNOSIS — G8929 Other chronic pain: Secondary | ICD-10-CM

## 2017-05-14 DIAGNOSIS — F17200 Nicotine dependence, unspecified, uncomplicated: Secondary | ICD-10-CM

## 2017-05-14 DIAGNOSIS — I152 Hypertension secondary to endocrine disorders: Secondary | ICD-10-CM

## 2017-05-14 DIAGNOSIS — Z23 Encounter for immunization: Secondary | ICD-10-CM

## 2017-05-14 DIAGNOSIS — F1120 Opioid dependence, uncomplicated: Secondary | ICD-10-CM | POA: Diagnosis not present

## 2017-05-14 LAB — CMP14+EGFR
A/G RATIO: 1.8 (ref 1.2–2.2)
ALK PHOS: 100 IU/L (ref 39–117)
ALT: 28 IU/L (ref 0–44)
AST: 18 IU/L (ref 0–40)
Albumin: 4.4 g/dL (ref 3.5–5.5)
BILIRUBIN TOTAL: 0.3 mg/dL (ref 0.0–1.2)
BUN/Creatinine Ratio: 11 (ref 9–20)
BUN: 10 mg/dL (ref 6–24)
CHLORIDE: 106 mmol/L (ref 96–106)
CO2: 22 mmol/L (ref 20–29)
Calcium: 9.4 mg/dL (ref 8.7–10.2)
Creatinine, Ser: 0.91 mg/dL (ref 0.76–1.27)
GFR calc non Af Amer: 97 mL/min/{1.73_m2} (ref >59)
GFR, EST AFRICAN AMERICAN: 112 mL/min/{1.73_m2} (ref >59)
GLUCOSE: 103 mg/dL — AB (ref 65–99)
Globulin, Total: 2.4 g/dL (ref 1.5–4.5)
POTASSIUM: 4.7 mmol/L (ref 3.5–5.2)
Sodium: 145 mmol/L — ABNORMAL HIGH (ref 134–144)
Total Protein: 6.8 g/dL (ref 6.0–8.5)

## 2017-05-14 LAB — LIPID PANEL
CHOLESTEROL TOTAL: 186 mg/dL (ref 100–199)
Chol/HDL Ratio: 6.6 ratio — ABNORMAL HIGH (ref 0.0–5.0)
HDL: 28 mg/dL — ABNORMAL LOW (ref >39)
LDL Calculated: 100 mg/dL — ABNORMAL HIGH (ref 0–99)
Triglycerides: 288 mg/dL — ABNORMAL HIGH (ref 0–149)
VLDL Cholesterol Cal: 58 mg/dL — ABNORMAL HIGH (ref 5–40)

## 2017-05-14 LAB — BAYER DCA HB A1C WAIVED: HB A1C: 6.5 % (ref <7.0)

## 2017-05-14 MED ORDER — OXYCODONE-ACETAMINOPHEN 5-325 MG PO TABS: 1.0000 | ORAL_TABLET | Freq: Two times a day (BID) | ORAL | 0 refills | Status: DC | PRN

## 2017-05-14 MED ORDER — AMLODIPINE BESYLATE 5 MG PO TABS: 5.0000 mg | ORAL_TABLET | Freq: Every day | ORAL | 3 refills | Status: DC

## 2017-05-14 NOTE — Addendum Note (Signed)
Addended by: Almeta MonasSTONE, JANIE M on: 05/14/2017 12:42 PM   Modules accepted: Orders

## 2017-05-14 NOTE — Patient Instructions (Signed)
Diabetes Mellitus and Food It is important for you to manage your blood sugar (glucose) level. Your blood glucose level can be greatly affected by what you eat. Eating healthier foods in the appropriate amounts throughout the day at about the same time each day will help you control your blood glucose level. It can also help slow or prevent worsening of your diabetes mellitus. Healthy eating may even help you improve the level of your blood pressure and reach or maintain a healthy weight. General recommendations for healthful eating and cooking habits include:  Eating meals and snacks regularly. Avoid going long periods of time without eating to lose weight.  Eating a diet that consists mainly of plant-based foods, such as fruits, vegetables, nuts, legumes, and whole grains.  Using low-heat cooking methods, such as baking, instead of high-heat cooking methods, such as deep frying.  Work with your dietitian to make sure you understand how to use the Nutrition Facts information on food labels. How can food affect me? Carbohydrates Carbohydrates affect your blood glucose level more than any other type of food. Your dietitian will help you determine how many carbohydrates to eat at each meal and teach you how to count carbohydrates. Counting carbohydrates is important to keep your blood glucose at a healthy level, especially if you are using insulin or taking certain medicines for diabetes mellitus. Alcohol Alcohol can cause sudden decreases in blood glucose (hypoglycemia), especially if you use insulin or take certain medicines for diabetes mellitus. Hypoglycemia can be a life-threatening condition. Symptoms of hypoglycemia (sleepiness, dizziness, and disorientation) are similar to symptoms of having too much alcohol. If your health care provider has given you approval to drink alcohol, do so in moderation and use the following guidelines:  Women should not have more than one drink per day, and men  should not have more than two drinks per day. One drink is equal to: ? 12 oz of beer. ? 5 oz of wine. ? 1 oz of hard liquor.  Do not drink on an empty stomach.  Keep yourself hydrated. Have water, diet soda, or unsweetened iced tea.  Regular soda, juice, and other mixers might contain a lot of carbohydrates and should be counted.  What foods are not recommended? As you make food choices, it is important to remember that all foods are not the same. Some foods have fewer nutrients per serving than other foods, even though they might have the same number of calories or carbohydrates. It is difficult to get your body what it needs when you eat foods with fewer nutrients. Examples of foods that you should avoid that are high in calories and carbohydrates but low in nutrients include:  Trans fats (most processed foods list trans fats on the Nutrition Facts label).  Regular soda.  Juice.  Candy.  Sweets, such as cake, pie, doughnuts, and cookies.  Fried foods.  What foods can I eat? Eat nutrient-rich foods, which will nourish your body and keep you healthy. The food you should eat also will depend on several factors, including:  The calories you need.  The medicines you take.  Your weight.  Your blood glucose level.  Your blood pressure level.  Your cholesterol level.  You should eat a variety of foods, including:  Protein. ? Lean cuts of meat. ? Proteins low in saturated fats, such as fish, egg whites, and beans. Avoid processed meats.  Fruits and vegetables. ? Fruits and vegetables that may help control blood glucose levels, such as apples,   mangoes, and yams.  Dairy products. ? Choose fat-free or low-fat dairy products, such as milk, yogurt, and cheese.  Grains, bread, pasta, and rice. ? Choose whole grain products, such as multigrain bread, whole oats, and brown rice. These foods may help control blood pressure.  Fats. ? Foods containing healthful fats, such as  nuts, avocado, olive oil, canola oil, and fish.  Does everyone with diabetes mellitus have the same meal plan? Because every person with diabetes mellitus is different, there is not one meal plan that works for everyone. It is very important that you meet with a dietitian who will help you create a meal plan that is just right for you. This information is not intended to replace advice given to you by your health care provider. Make sure you discuss any questions you have with your health care provider. Document Released: 04/03/2005 Document Revised: 12/13/2015 Document Reviewed: 06/03/2013 Elsevier Interactive Patient Education  2017 Elsevier Inc.  

## 2017-05-14 NOTE — Progress Notes (Signed)
Subjective:    Patient ID: Austin Combs, male    DOB: 1966-07-15, 51 y.o.   MRN: [id number]  Pt presents to the office today for chronic follow up and pain medication refill.  Diabetes  He presents for his follow-up diabetic visit. He has type 2 diabetes mellitus. His disease course has been stable. Hypoglycemia symptoms include nervousness/anxiousness. There are no diabetic associated symptoms. Pertinent negatives for diabetes include no blurred vision, no foot paresthesias, no foot ulcerations and no visual change. There are no hypoglycemic complications. Symptoms are stable. Pertinent negatives for diabetic complications include no CVA, heart disease, nephropathy or peripheral neuropathy. Risk factors for coronary artery disease include dyslipidemia, diabetes mellitus, male sex, obesity, hypertension and sedentary lifestyle. He is following a generally unhealthy diet. (Does not check BS at home ) Eye exam is not current.  Hypertension  This is a chronic problem. The current episode started more than 1 year ago. The problem is unchanged. The problem is uncontrolled. Associated symptoms include anxiety. Pertinent negatives include no blurred vision, malaise/fatigue, peripheral edema or shortness of breath. Risk factors for coronary artery disease include dyslipidemia, diabetes mellitus, obesity, male gender and sedentary lifestyle. The current treatment provides mild improvement. There is no history of kidney disease, CAD/MI, CVA or heart failure.  Hyperlipidemia  This is a chronic problem. The current episode started more than 1 year ago. The problem is uncontrolled. Recent lipid tests were reviewed and are high. Exacerbating diseases include obesity. Pertinent negatives include no shortness of breath. He is currently on no antihyperlipidemic treatment. The current treatment provides mild improvement of lipids. Risk factors for coronary artery disease include dyslipidemia, diabetes mellitus, male  sex, obesity and a sedentary lifestyle.  Anxiety  Presents for follow-up visit. Symptoms include decreased concentration, excessive worry, irritability and nervous/anxious behavior. Patient reports no restlessness or shortness of breath. Symptoms occur occasionally. The severity of symptoms is moderate. The quality of sleep is good.    Depression         This is a chronic problem.  The current episode started more than 1 year ago.   The onset quality is gradual.   The problem occurs intermittently.  The problem has been waxing and waning since onset.  Associated symptoms include decreased concentration.  Associated symptoms include no helplessness, no hopelessness, no restlessness and not sad.  Past medical history includes anxiety.   Back Pain  This is a chronic problem. The current episode started more than 1 year ago. The problem occurs intermittently. The problem is unchanged. The pain is present in the lumbar spine. The pain is moderate.  Metabolic Syndrome PT states he has a very active job. Does not take statin.     Review of Systems  Constitutional: Positive for irritability. Negative for malaise/fatigue.  Eyes: Negative for blurred vision.  Respiratory: Negative for shortness of breath.   Musculoskeletal: Positive for back pain.  Psychiatric/Behavioral: Positive for decreased concentration and depression. The patient is nervous/anxious.   All other systems reviewed and are negative.      Objective:   Physical Exam  Constitutional: He is oriented to person, place, and time. He appears well-developed and well-nourished. No distress.  HENT:  Head: Normocephalic.  Right Ear: External ear normal.  Left Ear: External ear normal.  Nose: Nose normal.  Mouth/Throat: Oropharynx is clear and moist.  Eyes: Pupils are equal, round, and reactive to light. Right eye exhibits no discharge. Left eye exhibits no discharge.  Neck: Normal range of motion. Neck  supple. No thyromegaly present.    Cardiovascular: Normal rate, regular rhythm, normal heart sounds and intact distal pulses.   No murmur heard. Pulmonary/Chest: Effort normal and breath sounds normal. No respiratory distress. He has no wheezes.  Abdominal: Soft. Bowel sounds are normal. He exhibits no distension. There is no tenderness.  Musculoskeletal: Normal range of motion. He exhibits no edema or tenderness.  Neurological: He is alert and oriented to person, place, and time.  Skin: Skin is warm and dry. No rash noted. No erythema.  Psychiatric: He has a normal mood and affect. His behavior is normal. Judgment and thought content normal.  Vitals reviewed.     BP (!) 152/93   Pulse 100   Temp 98 F (36.7 C) (Oral)   Ht '6\' 2"'  (1.88 m)   Wt 284 lb 3.2 oz (128.9 kg)   BMI 36.49 kg/m      Assessment & Plan:  1. Essential hypertension Will add Norvasc today -Dash diet information given -Exercise encouraged - Stress Management  -Continue current meds - CMP14+EGFR - amLODipine (NORVASC) 5 MG tablet; Take 1 tablet (5 mg total) by mouth daily.  Dispense: 90 tablet; Refill: 3  2. Morbid obesity ([place]) - CMP14+EGFR  3. GAD (generalized anxiety disorder) - CMP14+EGFR  4. Hyperlipidemia associated with type 2 diabetes mellitus (HCC) - CMP14+EGFR - Lipid panel  5. Chronic neck and back pain - CMP14+EGFR - oxyCODONE-acetaminophen (PERCOCET/ROXICET) 5-325 MG tablet; Take 1 tablet by mouth 2 (two) times daily as needed.  Dispense: 30 tablet; Refill: 0 - oxyCODONE-acetaminophen (ROXICET) 5-325 MG tablet; Take 1 tablet by mouth every 12 (twelve) hours as needed for severe pain.  Dispense: 30 tablet; Refill: 0 - oxyCODONE-acetaminophen (ROXICET) 5-325 MG tablet; Take 1 tablet by mouth every 12 (twelve) hours as needed for severe pain.  Dispense: 30 tablet; Refill: 0  6. Pain medication agreement signed - CMP14+EGFR - oxyCODONE-acetaminophen (PERCOCET/ROXICET) 5-325 MG tablet; Take 1 tablet by mouth 2 (two) times  daily as needed.  Dispense: 30 tablet; Refill: 0 - oxyCODONE-acetaminophen (ROXICET) 5-325 MG tablet; Take 1 tablet by mouth every 12 (twelve) hours as needed for severe pain.  Dispense: 30 tablet; Refill: 0 - oxyCODONE-acetaminophen (ROXICET) 5-325 MG tablet; Take 1 tablet by mouth every 12 (twelve) hours as needed for severe pain.  Dispense: 30 tablet; Refill: 0  7. Uncomplicated opioid dependence ([place]) - CMP14+EGFR - oxyCODONE-acetaminophen (PERCOCET/ROXICET) 5-325 MG tablet; Take 1 tablet by mouth 2 (two) times daily as needed.  Dispense: 30 tablet; Refill: 0 - oxyCODONE-acetaminophen (ROXICET) 5-325 MG tablet; Take 1 tablet by mouth every 12 (twelve) hours as needed for severe pain.  Dispense: 30 tablet; Refill: 0 - oxyCODONE-acetaminophen (ROXICET) 5-325 MG tablet; Take 1 tablet by mouth every 12 (twelve) hours as needed for severe pain.  Dispense: 30 tablet; Refill: 0  8. Current smoker Smoking cessation discussed - [id number]+QXIH  9. Metabolic syndrome - [id number]+KCMK  10. Type 2 diabetes mellitus with hyperglycemia, without long-term current use of insulin (HCC) Will restart Metformin today Low carb diet - Ambulatory referral to Ophthalmology - Bayer DCA Hb A1c Waived - CMP14+EGFR  11. Hypertension associated with diabetes [facility]) Discussed pt needs to restart Lipitor   Pt reviewed in [place] controlled database- Pt has only received controlled medications from me   Continue all meds Labs pending Health Maintenance reviewed Diet and exercise encouraged RTO 3 months  Evelina Dun, FNP

## 2017-05-19 ENCOUNTER — Other Ambulatory Visit: Payer: Self-pay | Admitting: Family

## 2017-05-19 MED ORDER — PITAVASTATIN CALCIUM 2 MG PO TABS
2.0000 mg | ORAL_TABLET | Freq: Every day | ORAL | 1 refills | Status: DC
Start: 2017-05-19 — End: 2017-09-10

## 2017-08-03 ENCOUNTER — Other Ambulatory Visit: Payer: Self-pay | Admitting: Family

## 2017-08-03 DIAGNOSIS — I10 Essential (primary) hypertension: Secondary | ICD-10-CM

## 2017-09-09 ENCOUNTER — Telehealth: Payer: Self-pay | Admitting: Family

## 2017-09-09 DIAGNOSIS — I10 Essential (primary) hypertension: Secondary | ICD-10-CM

## 2017-09-09 DIAGNOSIS — E118 Type 2 diabetes mellitus with unspecified complications: Secondary | ICD-10-CM

## 2017-09-09 DIAGNOSIS — E782 Mixed hyperlipidemia: Secondary | ICD-10-CM

## 2017-09-09 DIAGNOSIS — F411 Generalized anxiety disorder: Secondary | ICD-10-CM

## 2017-09-09 NOTE — Telephone Encounter (Signed)
What is the name of the medication? bp pills, lipitor, pain meds, and another bp pill, and bs medication  Have you contacted your pharmacy to request a refill? yes  Which pharmacy would you like this sent to? [facility], pt can not come in to be seen until after September 18, 2017 when his new insurance goes into effect happat on 09/25/2017  Patient notified that their request is being sent to the clinical staff for review and that they should receive a call once it is complete. If they do not receive a call within 24 hours they can check with their pharmacy or our office.

## 2017-09-10 MED ORDER — PITAVASTATIN CALCIUM 2 MG PO TABS: 2.0000 mg | ORAL_TABLET | Freq: Every day | ORAL | 1 refills | Status: DC

## 2017-09-10 MED ORDER — AMLODIPINE BESYLATE 5 MG PO TABS: 5.0000 mg | ORAL_TABLET | Freq: Every day | ORAL | 3 refills | Status: AC

## 2017-09-10 MED ORDER — LOSARTAN POTASSIUM 100 MG PO TABS: ORAL_TABLET | ORAL | 0 refills | Status: DC

## 2017-09-10 MED ORDER — ICOSAPENT ETHYL 1 G PO CAPS: 2.0000 g | ORAL_CAPSULE | Freq: Two times a day (BID) | ORAL | 6 refills | Status: AC

## 2017-09-10 MED ORDER — METFORMIN HCL ER 500 MG PO TB24: 500.0000 mg | ORAL_TABLET | Freq: Every day | ORAL | 1 refills | Status: AC

## 2017-09-10 MED ORDER — HYDROXYZINE HCL 25 MG PO TABS: 25.0000 mg | ORAL_TABLET | Freq: Three times a day (TID) | ORAL | 0 refills | Status: DC | PRN

## 2017-09-10 NOTE — Telephone Encounter (Signed)
Pt r/c.

## 2017-09-10 NOTE — Telephone Encounter (Signed)
Pt aware.

## 2017-09-10 NOTE — Telephone Encounter (Signed)
I refilled all medication except pain medication. I can not send these until his appt I am sorry per our office policy.

## 2017-09-10 NOTE — Telephone Encounter (Signed)
Last office visit and lab work was 05-14-2017.   Please advise if any refills will be ordered.

## 2017-09-25 ENCOUNTER — Ambulatory Visit: Payer: BLUE CROSS/BLUE SHIELD | Admitting: Family

## 2017-09-30 ENCOUNTER — Encounter: Payer: Self-pay | Admitting: Family

## 2017-10-23 ENCOUNTER — Ambulatory Visit: Payer: BLUE CROSS/BLUE SHIELD | Admitting: Family

## 2017-10-23 ENCOUNTER — Encounter: Payer: Self-pay | Admitting: Family

## 2017-10-23 VITALS — BP 131/81 | HR 98 | Temp 98.3°F | Ht 74.0 in | Wt 297.2 lb

## 2017-10-23 DIAGNOSIS — Z0000 Encounter for general adult medical examination without abnormal findings: Secondary | ICD-10-CM | POA: Diagnosis not present

## 2017-10-23 DIAGNOSIS — I10 Essential (primary) hypertension: Secondary | ICD-10-CM | POA: Diagnosis not present

## 2017-10-23 DIAGNOSIS — E1159 Type 2 diabetes mellitus with other circulatory complications: Secondary | ICD-10-CM | POA: Diagnosis not present

## 2017-10-23 DIAGNOSIS — F411 Generalized anxiety disorder: Secondary | ICD-10-CM

## 2017-10-23 DIAGNOSIS — Z1211 Encounter for screening for malignant neoplasm of colon: Secondary | ICD-10-CM

## 2017-10-23 DIAGNOSIS — Z0289 Encounter for other administrative examinations: Secondary | ICD-10-CM | POA: Diagnosis not present

## 2017-10-23 DIAGNOSIS — E1169 Type 2 diabetes mellitus with other specified complication: Secondary | ICD-10-CM | POA: Diagnosis not present

## 2017-10-23 DIAGNOSIS — M549 Dorsalgia, unspecified: Secondary | ICD-10-CM

## 2017-10-23 DIAGNOSIS — E785 Hyperlipidemia, unspecified: Secondary | ICD-10-CM | POA: Diagnosis not present

## 2017-10-23 DIAGNOSIS — E1165 Type 2 diabetes mellitus with hyperglycemia: Secondary | ICD-10-CM

## 2017-10-23 DIAGNOSIS — I152 Hypertension secondary to endocrine disorders: Secondary | ICD-10-CM

## 2017-10-23 DIAGNOSIS — G8929 Other chronic pain: Secondary | ICD-10-CM | POA: Diagnosis not present

## 2017-10-23 DIAGNOSIS — R5383 Other fatigue: Secondary | ICD-10-CM

## 2017-10-23 DIAGNOSIS — M542 Cervicalgia: Secondary | ICD-10-CM

## 2017-10-23 DIAGNOSIS — F1120 Opioid dependence, uncomplicated: Secondary | ICD-10-CM

## 2017-10-23 DIAGNOSIS — F17200 Nicotine dependence, unspecified, uncomplicated: Secondary | ICD-10-CM

## 2017-10-23 DIAGNOSIS — Z1212 Encounter for screening for malignant neoplasm of rectum: Secondary | ICD-10-CM

## 2017-10-23 LAB — BAYER DCA HB A1C WAIVED: HB A1C (BAYER DCA - WAIVED): 6.6 % (ref <7.0)

## 2017-10-23 MED ORDER — OXYCODONE-ACETAMINOPHEN 5-325 MG PO TABS: 1.0000 | ORAL_TABLET | Freq: Two times a day (BID) | ORAL | 0 refills | Status: AC | PRN

## 2017-10-23 MED ORDER — PITAVASTATIN CALCIUM 2 MG PO TABS: 2.0000 mg | ORAL_TABLET | Freq: Every day | ORAL | 1 refills | Status: AC

## 2017-10-23 NOTE — Progress Notes (Signed)
Subjective:    Patient ID: Austin Combs, male    DOB: 12-31-1965, 51 y.o.   MRN: [id number]  Pt presents to the office today for CPE.  Diabetes  He presents for his follow-up diabetic visit. He has type 2 diabetes mellitus. His disease course has been worsening. There are no hypoglycemic associated symptoms. Pertinent negatives for diabetes include no blurred vision, no foot paresthesias and no visual change. There are no hypoglycemic complications. Symptoms are stable. Pertinent negatives for diabetic complications include no CVA, heart disease or nephropathy. Risk factors for coronary artery disease include hypertension, male sex, dyslipidemia and family history. His weight is stable. (Does not check BS at home )  Hypertension  This is a chronic problem. The current episode started more than 1 year ago. The problem has been waxing and waning since onset. The problem is uncontrolled. Associated symptoms include neck pain. Pertinent negatives include no blurred vision. Risk factors for coronary artery disease include stress, smoking/tobacco exposure, obesity and male gender. The current treatment provides moderate improvement. There is no history of CAD/MI or CVA.  Hyperlipidemia  This is a chronic problem. The current episode started more than 1 year ago. The problem is uncontrolled. Recent lipid tests were reviewed and are high. Exacerbating diseases include obesity. Current antihyperlipidemic treatment includes statins. The current treatment provides moderate improvement of lipids. Risk factors for coronary artery disease include diabetes mellitus, dyslipidemia, male sex, hypertension and a sedentary lifestyle.  Neck Pain   This is a chronic problem. The current episode started more than 1 year ago. The problem occurs intermittently. The problem has been waxing and waning. The pain is at a severity of 7/10. The pain is moderate. Pertinent negatives include no visual change.      Review of  Systems  Eyes: Negative for blurred vision.  Musculoskeletal: Positive for neck pain.  All other systems reviewed and are negative.      Objective:   Physical Exam  Constitutional: He is oriented to person, place, and time. He appears well-developed and well-nourished. No distress.  HENT:  Head: Normocephalic.  Right Ear: External ear normal.  Left Ear: External ear normal.  Nose: Nose normal.  Mouth/Throat: Oropharynx is clear and moist.  Eyes: Pupils are equal, round, and reactive to light. Right eye exhibits no discharge. Left eye exhibits no discharge.  Neck: Normal range of motion. Neck supple. No thyromegaly present.  Cardiovascular: Normal rate, regular rhythm, normal heart sounds and intact distal pulses.  No murmur heard. Pulmonary/Chest: Effort normal and breath sounds normal. No respiratory distress. He has no wheezes.  Abdominal: Soft. Bowel sounds are normal. He exhibits no distension. There is no tenderness.  Musculoskeletal: Normal range of motion. He exhibits tenderness. He exhibits no edema.  Pain in posterior cervical region with rotation   Neurological: He is alert and oriented to person, place, and time.  Skin: Skin is warm and dry. No rash noted. No erythema.  Psychiatric: He has a normal mood and affect. His behavior is normal. Judgment and thought content normal.  Vitals reviewed.    BP 131/81   Pulse 98   Temp 98.3 F (36.8 C) (Oral)   Ht '6\' 2"'  (1.88 m)   Wt 297 lb 3.2 oz (134.8 kg)   BMI 38.16 kg/m      Assessment & Plan:  1. Hypertension associated with diabetes ([place]) - CMP14+EGFR - CBC with Differential/Platelet  2. Hyperlipidemia associated with type 2 diabetes mellitus ([facility]) - CMP14+EGFR - Lipid panel -  CBC with Differential/Platelet - Pitavastatin Calcium (LIVALO) 2 MG TABS; Take 1 tablet (2 mg total) by mouth daily.  Dispense: 90 tablet; Refill: 1  3. Type 2 diabetes mellitus with hyperglycemia, without long-term current use of  insulin ([facility]) - Bayer DCA Hb A1c Waived - CMP14+EGFR - CBC with Differential/Platelet  4. GAD (generalized anxiety disorder) - CMP14+EGFR - CBC with Differential/Platelet  5. Current smoker - CMP14+EGFR - CBC with Differential/Platelet  6. Chronic neck and back pain - CMP14+EGFR - CBC with Differential/Platelet - oxyCODONE-acetaminophen (ROXICET) 5-325 MG tablet; Take 1 tablet by mouth every 12 (twelve) hours as needed for severe pain.  Dispense: 30 tablet; Refill: 0 - oxyCODONE-acetaminophen (PERCOCET/ROXICET) 5-325 MG tablet; Take 1 tablet by mouth 2 (two) times daily as needed.  Dispense: 30 tablet; Refill: 0 - oxyCODONE-acetaminophen (ROXICET) 5-325 MG tablet; Take 1 tablet by mouth every 12 (twelve) hours as needed for severe pain.  Dispense: 30 tablet; Refill: 0  7. Pain medication agreement signed - CMP14+EGFR - CBC with Differential/Platelet - oxyCODONE-acetaminophen (ROXICET) 5-325 MG tablet; Take 1 tablet by mouth every 12 (twelve) hours as needed for severe pain.  Dispense: 30 tablet; Refill: 0 - oxyCODONE-acetaminophen (PERCOCET/ROXICET) 5-325 MG tablet; Take 1 tablet by mouth 2 (two) times daily as needed.  Dispense: 30 tablet; Refill: 0 - oxyCODONE-acetaminophen (ROXICET) 5-325 MG tablet; Take 1 tablet by mouth every 12 (twelve) hours as needed for severe pain.  Dispense: 30 tablet; Refill: 0  8. Uncomplicated opioid dependence ([place]) - CMP14+EGFR - CBC with Differential/Platelet - oxyCODONE-acetaminophen (ROXICET) 5-325 MG tablet; Take 1 tablet by mouth every 12 (twelve) hours as needed for severe pain.  Dispense: 30 tablet; Refill: 0 - oxyCODONE-acetaminophen (PERCOCET/ROXICET) 5-325 MG tablet; Take 1 tablet by mouth 2 (two) times daily as needed.  Dispense: 30 tablet; Refill: 0 - oxyCODONE-acetaminophen (ROXICET) 5-325 MG tablet; Take 1 tablet by mouth every 12 (twelve) hours as needed for severe pain.  Dispense: 30 tablet; Refill: 0  9. Morbid obesity ([place]) -  CMP14+EGFR - CBC with Differential/Platelet  10. Fatigue, unspecified type - CMP14+EGFR - TSH - VITAMIN D 25 Hydroxy (Vit-D Deficiency, Fractures) - CBC with Differential/Platelet  11. Annual physical exam - Bayer DCA Hb A1c Waived - CMP14+EGFR - Lipid panel - TSH - VITAMIN D 25 Hydroxy (Vit-D Deficiency, Fractures) - PSA, total and free - CBC with Differential/Platelet  12. Colon cancer screening - Cologuard  13. Screening for malignant neoplasm of the rectum - Cologuard   Continue all meds Labs pending Health Maintenance reviewed Diet and exercise encouraged RTO 6 months   Evelina Dun, FNP

## 2017-10-23 NOTE — Patient Instructions (Signed)
Fat and Cholesterol Restricted Diet Getting too much fat and cholesterol in your diet may cause health problems. Following this diet helps keep your fat and cholesterol at normal levels. This can keep you from getting sick. What types of fat should I choose?  Choose monosaturated and polyunsaturated fats. These are found in foods such as olive oil, canola oil, flaxseeds, walnuts, almonds, and seeds.  Eat more omega-3 fats. Good choices include salmon, mackerel, sardines, tuna, flaxseed oil, and ground flaxseeds.  Limit saturated fats. These are in animal products such as meats, butter, and cream. They can also be in plant products such as palm oil, palm kernel oil, and coconut oil.  Avoid foods with partially hydrogenated oils in them. These contain trans fats. Examples of foods that have trans fats are stick margarine, some tub margarines, cookies, crackers, and other baked goods. What general guidelines do I need to follow?  Check food labels. Look for the words "trans fat" and "saturated fat."  When preparing a meal: ? Fill half of your plate with vegetables and green salads. ? Fill one fourth of your plate with whole grains. Look for the word "whole" as the first word in the ingredient list. ? Fill one fourth of your plate with lean protein foods.  Eat more foods that have fiber, like apples, carrots, beans, peas, and barley.  Eat more home-cooked foods. Eat less at restaurants and buffets.  Limit or avoid alcohol.  Limit foods high in starch and sugar.  Limit fried foods.  Cook foods without frying them. Baking, boiling, grilling, and broiling are all great options.  Lose weight if you are overweight. Losing even a small amount of weight can help your overall health. It can also help prevent diseases such as diabetes and heart disease. What foods can I eat? Grains Whole grains, such as whole wheat or whole grain breads, crackers, cereals, and pasta. Unsweetened oatmeal,  bulgur, barley, quinoa, or brown rice. Corn or whole wheat flour tortillas. Vegetables Fresh or frozen vegetables (raw, steamed, roasted, or grilled). Green salads. Fruits All fresh, canned (in natural juice), or frozen fruits. Meat and Other Protein Products Ground beef (85% or leaner), grass-fed beef, or beef trimmed of fat. Skinless chicken or turkey. Ground chicken or turkey. Pork trimmed of fat. All fish and seafood. Eggs. Dried beans, peas, or lentils. Unsalted nuts or seeds. Unsalted canned or dry beans. Dairy Low-fat dairy products, such as skim or 1% milk, 2% or reduced-fat cheeses, low-fat ricotta or cottage cheese, or plain low-fat yogurt. Fats and Oils Tub margarines without trans fats. Light or reduced-fat mayonnaise and salad dressings. Avocado. Olive, canola, sesame, or safflower oils. Natural peanut or almond butter (choose ones without added sugar and oil). The items listed above may not be a complete list of recommended foods or beverages. Contact your dietitian for more options. What foods are not recommended? Grains White bread. White pasta. White rice. Cornbread. Bagels, pastries, and croissants. Crackers that contain trans fat. Vegetables White potatoes. Corn. Creamed or fried vegetables. Vegetables in a cheese sauce. Fruits Dried fruits. Canned fruit in light or heavy syrup. Fruit juice. Meat and Other Protein Products Fatty cuts of meat. Ribs, chicken wings, bacon, sausage, bologna, salami, chitterlings, fatback, hot dogs, bratwurst, and packaged luncheon meats. Liver and organ meats. Dairy Whole or 2% milk, cream, half-and-half, and cream cheese. Whole milk cheeses. Whole-fat or sweetened yogurt. Full-fat cheeses. Nondairy creamers and whipped toppings. Processed cheese, cheese spreads, or cheese curds. Sweets and Desserts Corn   syrup, sugars, honey, and molasses. Candy. Jam and jelly. Syrup. Sweetened cereals. Cookies, pies, cakes, donuts, muffins, and ice  cream. Fats and Oils Butter, stick margarine, lard, shortening, ghee, or bacon fat. Coconut, palm kernel, or palm oils. Beverages Alcohol. Sweetened drinks (such as sodas, lemonade, and fruit drinks or punches). The items listed above may not be a complete list of foods and beverages to avoid. Contact your dietitian for more information. This information is not intended to replace advice given to you by your health care provider. Make sure you discuss any questions you have with your health care provider. Document Released: 01/06/2012 Document Revised: 03/13/2016 Document Reviewed: 10/06/2013 Elsevier Interactive Patient Education  2018 Else[employer] Inc.  

## 2017-10-24 LAB — PSA, TOTAL AND FREE
PROSTATE SPECIFIC AG, SERUM: 0.7 ng/mL (ref 0.0–4.0)
PSA FREE PCT: 15.7 %
PSA FREE: 0.11 ng/mL

## 2017-10-24 LAB — CMP14+EGFR
ALK PHOS: 90 IU/L (ref 39–117)
ALT: 29 IU/L (ref 0–44)
AST: 15 IU/L (ref 0–40)
Albumin/Globulin Ratio: 1.5 (ref 1.2–2.2)
Albumin: 4 g/dL (ref 3.5–5.5)
BILIRUBIN TOTAL: 0.3 mg/dL (ref 0.0–1.2)
BUN/Creatinine Ratio: 15 (ref 9–20)
BUN: 12 mg/dL (ref 6–24)
CHLORIDE: 106 mmol/L (ref 96–106)
CO2: 22 mmol/L (ref 20–29)
CREATININE: 0.8 mg/dL (ref 0.76–1.27)
Calcium: 9.6 mg/dL (ref 8.7–10.2)
GFR calc Af Amer: 119 mL/min/{1.73_m2} (ref >59)
GFR calc non Af Amer: 103 mL/min/{1.73_m2} (ref >59)
GLOBULIN, TOTAL: 2.6 g/dL (ref 1.5–4.5)
Glucose: 110 mg/dL — ABNORMAL HIGH (ref 65–99)
POTASSIUM: 4.5 mmol/L (ref 3.5–5.2)
SODIUM: 146 mmol/L — AB (ref 134–144)
Total Protein: 6.6 g/dL (ref 6.0–8.5)

## 2017-10-24 LAB — CBC WITH DIFFERENTIAL/PLATELET
BASOS ABS: 0.1 10*3/uL (ref 0.0–0.2)
Basos: 0 %
EOS (ABSOLUTE): 0.4 10*3/uL (ref 0.0–0.4)
Eos: 3 %
Hematocrit: 45.9 % (ref 37.5–51.0)
Hemoglobin: 15.2 g/dL (ref 13.0–17.7)
Immature Grans (Abs): 0 10*3/uL (ref 0.0–0.1)
Immature Granulocytes: 0 %
LYMPHS ABS: 4.2 10*3/uL — AB (ref 0.7–3.1)
Lymphs: 35 %
MCH: 30.5 pg (ref 26.6–33.0)
MCHC: 33.1 g/dL (ref 31.5–35.7)
MCV: 92 fL (ref 79–97)
MONOS ABS: 1 10*3/uL — AB (ref 0.1–0.9)
Monocytes: 8 %
NEUTROS ABS: 6.5 10*3/uL (ref 1.4–7.0)
Neutrophils: 54 %
Platelets: 357 10*3/uL (ref 150–379)
RBC: 4.98 x10E6/uL (ref 4.14–5.80)
RDW: 13.5 % (ref 12.3–15.4)
WBC: 12.2 10*3/uL — ABNORMAL HIGH (ref 3.4–10.8)

## 2017-10-24 LAB — LIPID PANEL
CHOLESTEROL TOTAL: 189 mg/dL (ref 100–199)
Chol/HDL Ratio: 6.5 ratio — ABNORMAL HIGH (ref 0.0–5.0)
HDL: 29 mg/dL — ABNORMAL LOW (ref >39)
LDL CALC: 100 mg/dL — AB (ref 0–99)
TRIGLYCERIDES: 300 mg/dL — AB (ref 0–149)
VLDL Cholesterol Cal: 60 mg/dL — ABNORMAL HIGH (ref 5–40)

## 2017-10-24 LAB — MICROALBUMIN / CREATININE URINE RATIO
CREATININE, UR: 225.4 mg/dL
MICROALBUM., U, RANDOM: 59.1 ug/mL
Microalb/Creat Ratio: 26.2 mg/g creat (ref 0.0–30.0)

## 2017-10-24 LAB — VITAMIN D 25 HYDROXY (VIT D DEFICIENCY, FRACTURES): VIT D 25 HYDROXY: 12 ng/mL — AB (ref 30.0–100.0)

## 2017-10-24 LAB — TSH: TSH: 1.16 u[IU]/mL (ref 0.450–4.500)

## 2017-10-26 ENCOUNTER — Other Ambulatory Visit: Payer: Self-pay | Admitting: Family

## 2017-10-26 MED ORDER — VITAMIN D (ERGOCALCIFEROL) 1.25 MG (50000 UNIT) PO CAPS: 50000.0000 [IU] | ORAL_CAPSULE | ORAL | 3 refills | Status: AC

## 2017-10-29 LAB — TOXASSURE SELECT 13 (MW), URINE

## 2018-02-15 ENCOUNTER — Other Ambulatory Visit: Payer: Self-pay | Admitting: Family

## 2018-02-15 DIAGNOSIS — I10 Essential (primary) hypertension: Secondary | ICD-10-CM

## 2018-06-17 ENCOUNTER — Other Ambulatory Visit: Payer: Self-pay | Admitting: Family

## 2018-06-17 DIAGNOSIS — I10 Essential (primary) hypertension: Secondary | ICD-10-CM

## 2018-06-18 NOTE — Telephone Encounter (Signed)
Last seen 10/23/17

## 2018-10-29 ENCOUNTER — Other Ambulatory Visit: Payer: Self-pay | Admitting: Family

## 2018-10-29 DIAGNOSIS — I10 Essential (primary) hypertension: Secondary | ICD-10-CM

## 2018-11-03 ENCOUNTER — Other Ambulatory Visit: Payer: Self-pay | Admitting: Family

## 2018-11-03 DIAGNOSIS — I10 Essential (primary) hypertension: Secondary | ICD-10-CM

## 2018-11-04 ENCOUNTER — Other Ambulatory Visit: Payer: Self-pay | Admitting: Family

## 2018-11-04 DIAGNOSIS — I10 Essential (primary) hypertension: Secondary | ICD-10-CM

## 2018-11-05 ENCOUNTER — Other Ambulatory Visit: Payer: Self-pay | Admitting: Family

## 2018-11-05 DIAGNOSIS — I10 Essential (primary) hypertension: Secondary | ICD-10-CM

## 2018-11-08 ENCOUNTER — Telehealth: Payer: Self-pay | Admitting: Family

## 2018-11-08 DIAGNOSIS — I10 Essential (primary) hypertension: Secondary | ICD-10-CM

## 2018-11-08 MED ORDER — LOSARTAN POTASSIUM 100 MG PO TABS: 100.0000 mg | ORAL_TABLET | Freq: Every day | ORAL | 0 refills | Status: AC

## 2018-11-08 NOTE — Telephone Encounter (Signed)
Will forward to Dr. Louanne Skye to authorize- covering for Jannifer Rodney, FNP

## 2018-11-08 NOTE — Telephone Encounter (Signed)
Patient's wife notified and verbalized understanding.  

## 2018-11-08 NOTE — Telephone Encounter (Signed)
30 day supply sent to [facility] 

## 2018-11-08 NOTE — Telephone Encounter (Signed)
Yes go ahead and send the patient a 30-day supply

## 2018-11-08 NOTE — Telephone Encounter (Signed)
What is the name of the medication? losartan (COZAAR) 100 MG tablet  Have you contacted your pharmacy to request a refill? YES Which pharmacy would you like this sent to? [facility] [address], has not been seen since 2019, wants 30 day supply until he is able to go back to work and have insurance    Patient notified that their request is being sent to the clinical staff for review and that they should receive a call once it is complete. If they do not receive a call within 24 hours they can check with their pharmacy or our office.

## 2018-11-12 ENCOUNTER — Ambulatory Visit: Payer: BLUE CROSS/BLUE SHIELD | Admitting: Family

## 2018-12-21 ENCOUNTER — Ambulatory Visit: Payer: BLUE CROSS/BLUE SHIELD | Admitting: Family

## 2018-12-23 ENCOUNTER — Telehealth: Payer: Self-pay | Admitting: Family

## 2020-10-18 ENCOUNTER — Ambulatory Visit: Payer: Self-pay | Admitting: Family

## 2020-10-22 ENCOUNTER — Encounter: Payer: Self-pay | Admitting: Family

## 2024-04-29 ENCOUNTER — Emergency Department (HOSPITAL_COMMUNITY)
Admission: EM | Admit: 2024-04-29 | Discharge: 2024-04-29 | Disposition: A | Discharge status: Home / Self Care | Payer: Self-pay

## 2024-04-29 ENCOUNTER — Encounter (HOSPITAL_COMMUNITY): Payer: Self-pay | Admitting: Emergency Medicine

## 2024-04-29 ENCOUNTER — Emergency Department (HOSPITAL_COMMUNITY): Payer: Self-pay

## 2024-04-29 ENCOUNTER — Other Ambulatory Visit: Payer: Self-pay

## 2024-04-29 DIAGNOSIS — E119 Type 2 diabetes mellitus without complications: Secondary | ICD-10-CM | POA: Diagnosis not present

## 2024-04-29 DIAGNOSIS — Z7984 Long term (current) use of oral hypoglycemic drugs: Secondary | ICD-10-CM | POA: Diagnosis not present

## 2024-04-29 DIAGNOSIS — R42 Dizziness and giddiness: Secondary | ICD-10-CM

## 2024-04-29 DIAGNOSIS — I10 Essential (primary) hypertension: Secondary | ICD-10-CM | POA: Diagnosis not present

## 2024-04-29 LAB — CBC
HCT: 49.8 % (ref 39.0–52.0)
Hemoglobin: 17 g/dL (ref 13.0–17.0)
MCH: 30.9 pg (ref 26.0–34.0)
MCHC: 34.1 g/dL (ref 30.0–36.0)
MCV: 90.5 fL (ref 80.0–100.0)
Platelets: 375 K/uL (ref 150–400)
RBC: 5.5 MIL/uL (ref 4.22–5.81)
RDW: 12.4 % (ref 11.5–15.5)
WBC: 14.7 K/uL — ABNORMAL HIGH (ref 4.0–10.5)
nRBC: 0 % (ref 0.0–0.2)

## 2024-04-29 LAB — BASIC METABOLIC PANEL WITH GFR
Anion gap: 15 (ref 5–15)
BUN: 8 mg/dL (ref 6–20)
CO2: 26 mmol/L (ref 22–32)
Calcium: 9.8 mg/dL (ref 8.9–10.3)
Chloride: 99 mmol/L (ref 98–111)
Creatinine, Ser: 0.82 mg/dL (ref 0.61–1.24)
GFR, Estimated: >60 mL/min (ref >60)
Glucose, Bld: 163 mg/dL — ABNORMAL HIGH (ref 70–99)
Potassium: 3.9 mmol/L (ref 3.5–5.1)
Sodium: 140 mmol/L (ref 135–145)

## 2024-04-29 LAB — D-DIMER, QUANTITATIVE: D-Dimer, Quant: 0.4 ug{FEU}/mL (ref 0.00–0.50)

## 2024-04-29 LAB — TROPONIN T, HIGH SENSITIVITY
Troponin T High Sensitivity: 18 ng/L (ref 0–19)
Troponin T High Sensitivity: 19 ng/L (ref 0–19)

## 2024-04-29 MED ORDER — SODIUM CHLORIDE 0.9 % IV BOLUS
500.0000 mL | Freq: Once | INTRAVENOUS | Status: AC
  Administered 2024-04-29: 500 mL via INTRAVENOUS

## 2024-04-29 NOTE — ED Provider Notes (Signed)
 [place] [facility]PART[facility] AT [facility] Provider Note   CSN: [id number] Arrival date & time: 04/29/24  1053     Patient presents with: Hypertension   Austin Combs is a 58 y.o. male.  Presents ER with concern for high blood pressure.  He states he woke up this morning around 930 and notes to that he had a headache and felt like he was about to pass out, checked his blood pressure and it was in the 190s systolic.  He took a dose of his losartan  which she normally takes at bedtime and has been compliant with prior to arrival.  States this headache is better, he had been having some numbness and tingling all over his body which is also resolved.  He states when he got here his blood pressure started coming down and he was feeling better but states just before I came in the room he started feeling lightheaded and his blood pressure got up again.  He states he also feels short of breath.  He reports some chest pain but he states he has this chronically from nerve damage.  He denies any diaphoresis.   Hypertension       Prior to Admission medications   Medication Sig Start Date End Date Taking? Authorizing Provider  amLODipine  (NORVASC ) 5 MG tablet Take 1 tablet (5 mg total) by mouth daily. 09/10/17   Lavell Bari LABOR, FNP  Icosapent  Ethyl (VASCEPA ) 1 g CAPS Take 2 capsules (2 g total) by mouth 2 (two) times daily. Patient not taking: Reported on 10/23/2017 09/10/17   Lavell Bari A, FNP  losartan  (COZAAR ) 100 MG tablet Take 1 tablet (100 mg total) by mouth daily. 11/08/18   Dettinger, Fonda LABOR, MD  metFORMIN  (GLUCOPHAGE -XR) 500 MG 24 hr tablet Take 1 tablet (500 mg total) by mouth daily with breakfast. Patient not taking: Reported on 10/23/2017 09/10/17   Lavell Bari LABOR, FNP  oxyCODONE -acetaminophen  (PERCOCET/ROXICET) 5-325 MG tablet Take 1 tablet by mouth 2 (two) times daily as needed. 10/23/17   Lavell Bari LABOR, FNP  oxyCODONE -acetaminophen  (ROXICET) 5-325 MG tablet Take 1  tablet by mouth every 12 (twelve) hours as needed for severe pain. 10/23/17   Lavell Bari LABOR, FNP  oxyCODONE -acetaminophen  (ROXICET) 5-325 MG tablet Take 1 tablet by mouth every 12 (twelve) hours as needed for severe pain. 10/23/17   Lavell Bari A, FNP  Pitavastatin  Calcium  (LIVALO ) 2 MG TABS Take 1 tablet (2 mg total) by mouth daily. 10/23/17   Lavell Bari A, FNP  Vitamin D , Ergocalciferol , (DRISDOL ) 50000 units CAPS capsule Take 1 capsule (50,000 Units total) by mouth every 7 (seven) days. 10/26/17   Lavell Bari LABOR, FNP    Allergies: Patient has no known allergies.    Review of Systems  Updated Vital Signs BP (!) 158/108   Pulse (!) 104   Temp 98.8 F (37.1 C) (Oral)   Resp 15   Ht 6' 2 (1.88 m)   Wt (!) 145 kg   SpO2 93%   BMI 41.04 kg/m   Physical Exam Vitals and nursing note reviewed.  Constitutional:      General: He is not in acute distress.    Appearance: He is well-developed.  HENT:     Head: Normocephalic and atraumatic.     Mouth/Throat:     Mouth: Mucous membranes are moist.  Eyes:     Extraocular Movements: Extraocular movements intact.     Conjunctiva/sclera: Conjunctivae normal.     Pupils: Pupils are equal,  round, and reactive to light.  Cardiovascular:     Rate and Rhythm: Normal rate and regular rhythm.     Heart sounds: No murmur heard. Pulmonary:     Effort: Pulmonary effort is normal. No respiratory distress.     Breath sounds: Normal breath sounds.  Abdominal:     Palpations: Abdomen is soft.     Tenderness: There is no abdominal tenderness.  Musculoskeletal:        General: No swelling.     Cervical back: Neck supple.  Skin:    General: Skin is warm and dry.     Capillary Refill: Capillary refill takes less than 2 seconds.  Neurological:     General: No focal deficit present.     Mental Status: He is alert and oriented to person, place, and time.  Psychiatric:        Mood and Affect: Mood normal.     (all labs ordered are listed, but  only abnormal results are displayed) Labs Reviewed  BASIC METABOLIC PANEL WITH GFR  CBC  TROPONIN T, HIGH SENSITIVITY    EKG: EKG Interpretation Date/Time:  Friday April 29 2024 12:39:52 EDT Ventricular Rate:  95 PR Interval:  154 QRS Duration:  100 QT Interval:  346 QTC Calculation: 435 R Axis:   -33  Text Interpretation: Sinus rhythm Left axis deviation RSR' in V1 or V2, probably normal variant Confirmed by Simon Rea [phone]) on 04/29/2024 12:41:14 PM  Radiology: No results found.   Procedures   Medications Ordered in the ED - No data to display                                  Medical Decision Making This patient presents to the ED for concern of dizziness and elevated blood pressure, this involves an extensive number of treatment options, and is a complaint that carries with it a high risk of complications and morbidity.  The differential diagnosis includes hypertensive emergency, ACS, PE, pneumonia, dehydration, electrolyte abnormality, arrhythmia, other   Co morbidities that complicate the patient evaluation  Hypertension, diabetes, anxiety   Additional history obtained:  Additional history obtained from EMR External records from outside source obtained and reviewed including previous notes, labs, imaging   Lab Tests:  I Ordered, and personally interpreted labs.  The pertinent results include:  normal troponin, negative d-dimer, CBC with wbc 14.7.     Imaging Studies ordered:  I ordered imaging studies including chest x-ray I independently visualized and interpreted imaging which showed no pulmonary edema, no infiltrate I agree with the radiologist interpretation     Problem List / ED Course / Critical interventions / Medication management  Patient presented with lightheadedness noted his blood pressure was high he is also saying he feels short of breath, family states he also has panic attacks and were worried that this was contributing to  symptoms as well.  Symptoms started upon waking up this morning, not having any active chest pain, blood pressure has gradually improved.  The ED visit is back to normal, he was initially tachycardic, D-dimer ordered and is negative he is low risk for PE.  No signs of pneumonia he no other infectious symptoms, he does have mild leukocytosis but again no signs or symptoms to suggest infection.  He is now sleeping, states he feels back to normal.  Symptoms were not vertiginous in nature, his symptoms seem to be worse when he stood  up.  Symptoms likely due to orthostatic hypotension and resolved with IV fluids, advised follow-up PCP and given strict return precautions.  I have reviewed the patients home medicines and have made adjustments as needed       Amount and/or Complexity of Data Reviewed Labs: ordered. Radiology: ordered.        Final diagnoses:  None    ED Discharge Orders     None          Suellen Sherran DELENA DEVONNA 04/29/24 1556    Simon Lavonia SAILOR, MD 05/02/24 [phone]

## 2024-04-29 NOTE — ED Triage Notes (Signed)
 Pt bib [facility] for a headache and hypertension w/ systolic in 190s. Also c/o of feeling sob.

## 2024-04-29 NOTE — Discharge Instructions (Signed)
 It was a pleasure taking care of you today.  You are seen for dizziness and high blood pressure.  Fortunately blood pressure here has returned to normal and her dizziness is resolved after IV fluids.  Drink plain fluids and rest as mild dehydration could have been the cause of your lightheadedness.  The remainder of your workup was reassuring, you have normal EKG.  If you develop new or worsening symptoms, become very dizzy persistently, have chest pain or shortness of breath, passout or have any other worrisome symptoms come back to the ER right away.  Otherwise follow-up with your primary care doctor.
